# Patient Record
Sex: Female | Born: 1950 | Race: White | Hispanic: No | Smoking: Never smoker
Health system: Southern US, Community
[De-identification: ages and names within clinical notes are randomized; demographics above are authoritative.]

## PROBLEM LIST (undated history)

## (undated) DIAGNOSIS — T753XXA Motion sickness, initial encounter: Secondary | ICD-10-CM

## (undated) DIAGNOSIS — E785 Hyperlipidemia, unspecified: Secondary | ICD-10-CM

## (undated) DIAGNOSIS — I10 Essential (primary) hypertension: Secondary | ICD-10-CM

## (undated) DIAGNOSIS — K219 Gastro-esophageal reflux disease without esophagitis: Secondary | ICD-10-CM

## (undated) DIAGNOSIS — C801 Malignant (primary) neoplasm, unspecified: Secondary | ICD-10-CM

## (undated) HISTORY — DX: Gastro-esophageal reflux disease without esophagitis: K21.9

## (undated) HISTORY — DX: Malignant (primary) neoplasm, unspecified: C80.1

## (undated) HISTORY — DX: Essential (primary) hypertension: I10

---

## 2005-12-12 ENCOUNTER — Inpatient Hospital Stay: Payer: Self-pay

## 2005-12-12 ENCOUNTER — Other Ambulatory Visit: Payer: Self-pay

## 2006-04-06 ENCOUNTER — Ambulatory Visit: Payer: Self-pay | Admitting: Urology

## 2006-09-12 ENCOUNTER — Ambulatory Visit: Payer: Self-pay | Admitting: Urology

## 2007-06-17 ENCOUNTER — Ambulatory Visit: Payer: Self-pay | Admitting: Urology

## 2007-09-19 ENCOUNTER — Ambulatory Visit: Payer: Self-pay | Admitting: General Surgery

## 2008-06-17 ENCOUNTER — Ambulatory Visit: Payer: Self-pay | Admitting: Urology

## 2009-06-21 ENCOUNTER — Ambulatory Visit: Payer: Self-pay | Admitting: Urology

## 2010-07-06 ENCOUNTER — Ambulatory Visit: Payer: Self-pay | Admitting: Urology

## 2010-12-26 IMAGING — US US RENAL KIDNEY
1 series · 17 of 25 positions shown · non-contrast
Comparison: none

REASON FOR EXAM: nephrolithasis  renal neoplasm uncertainity
COMMENTS:

[Series 1: us renal kidney · 17 of 39 slices shown]
[im 1/39]
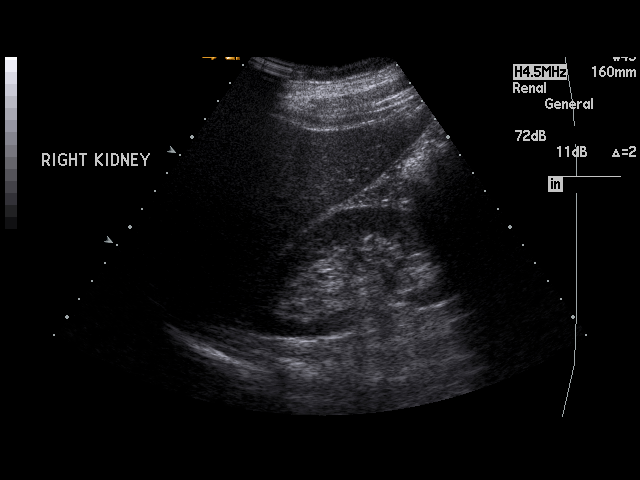
[im 4/39]
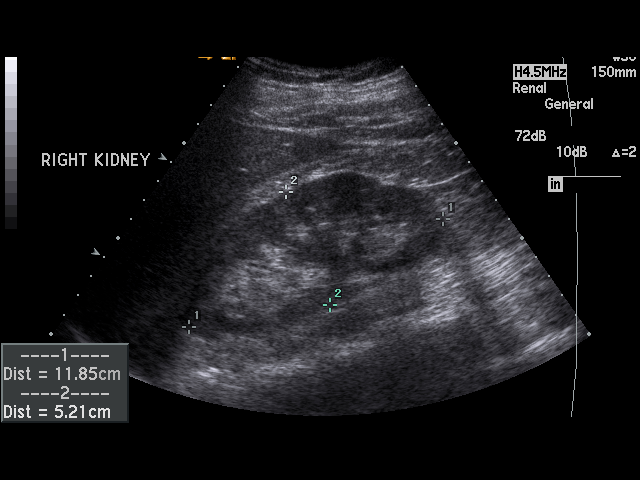
[im 5/39]
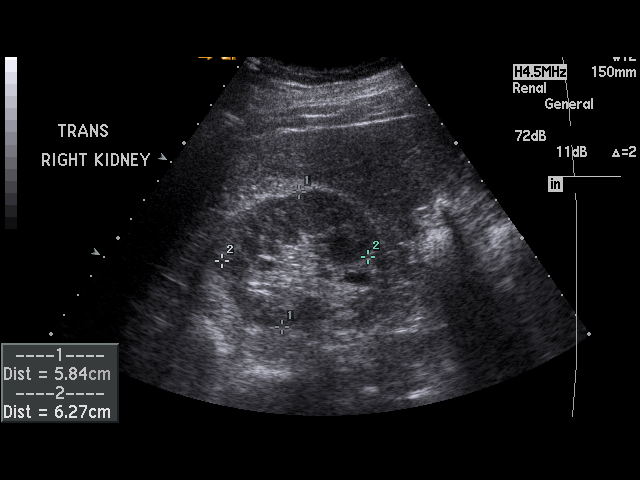
[im 8/39]
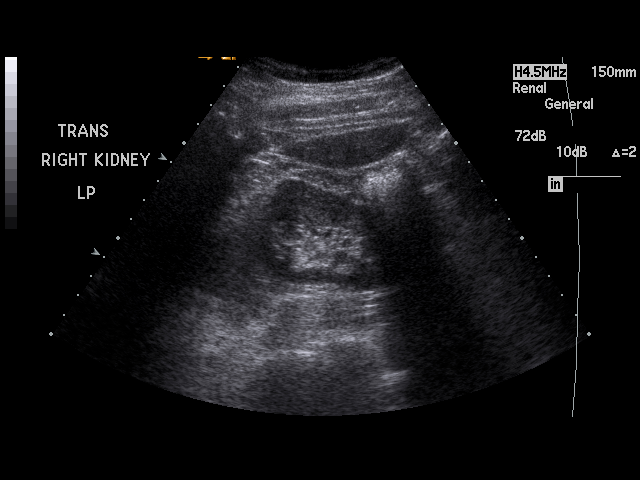
[im 10/39]
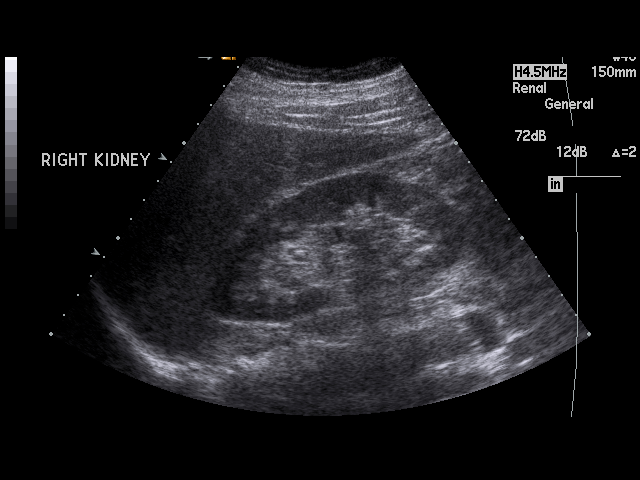
[im 13/39]
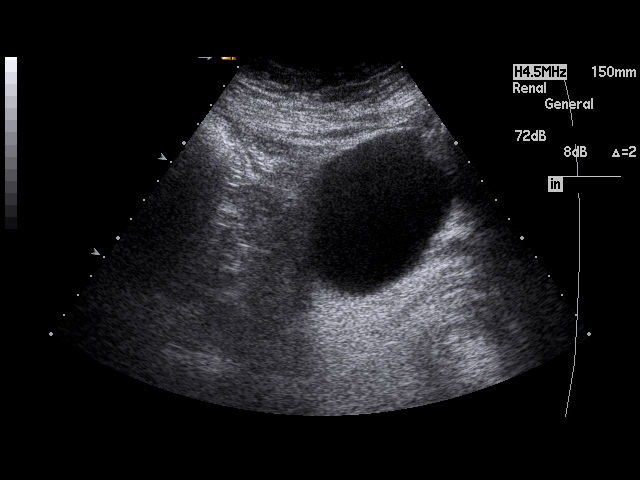
[im 15/39]
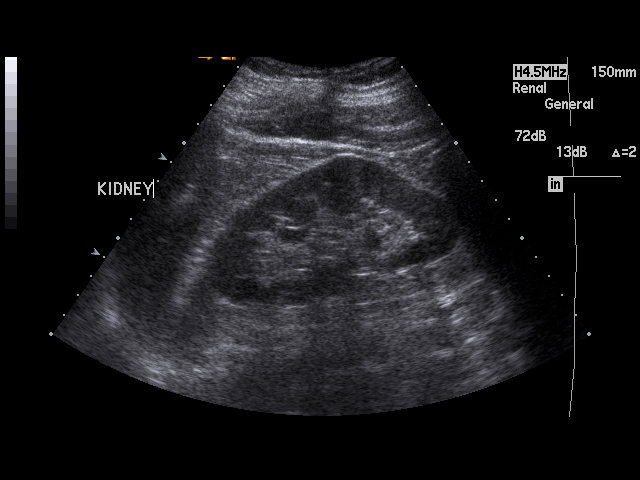
[im 18/39]
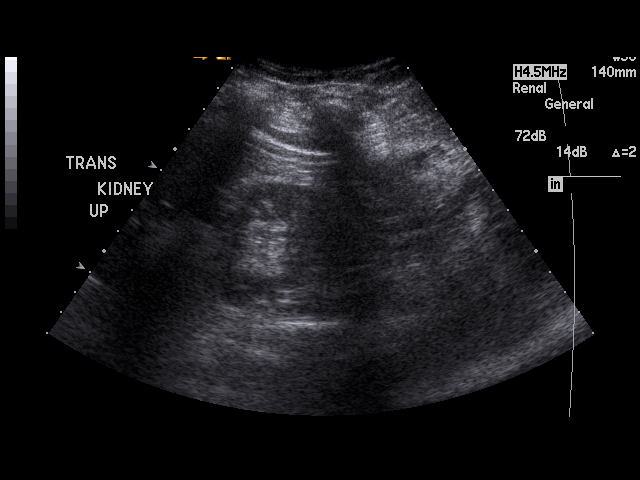
[im 20/39]
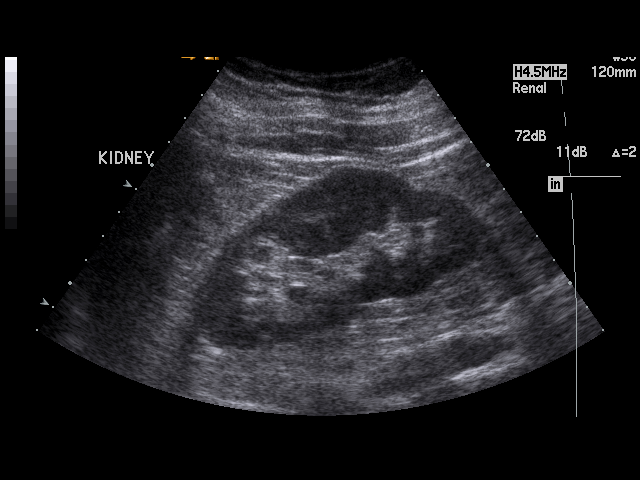
[im 21/39]
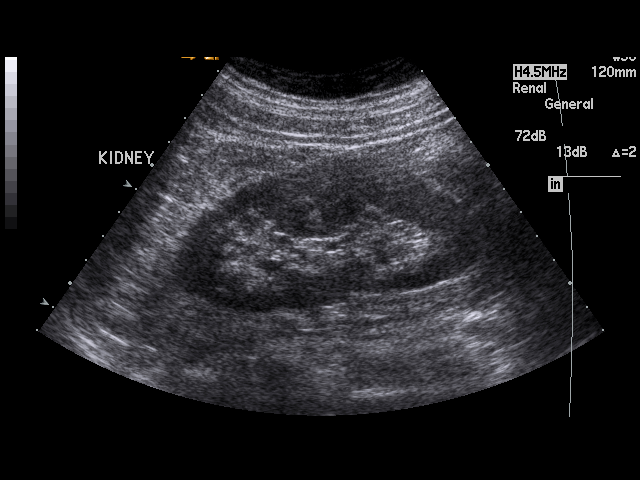
[im 24/39]
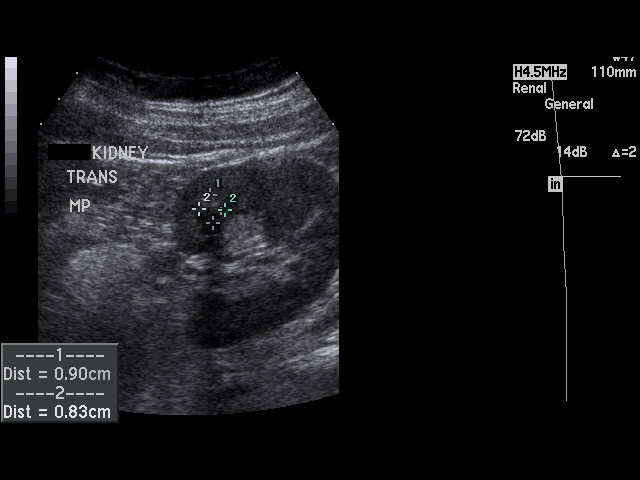
[im 26/39]
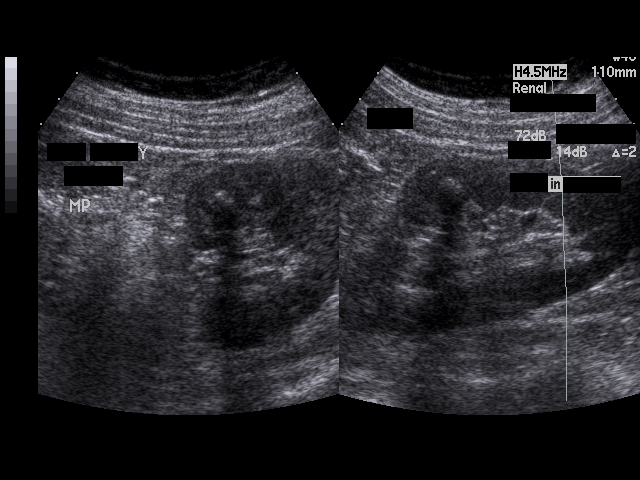
[im 29/39]
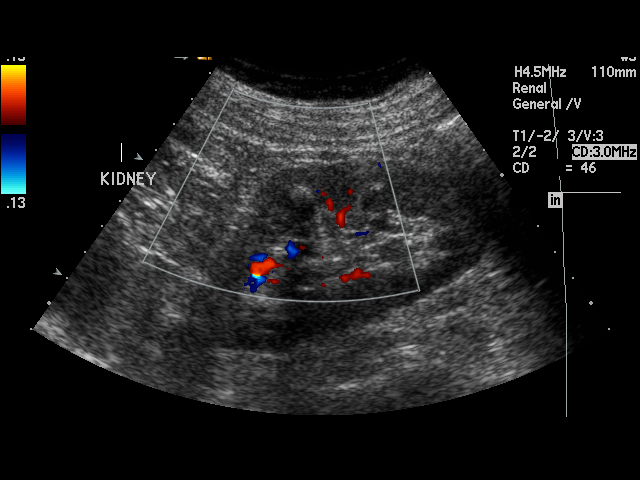
[im 31/39]
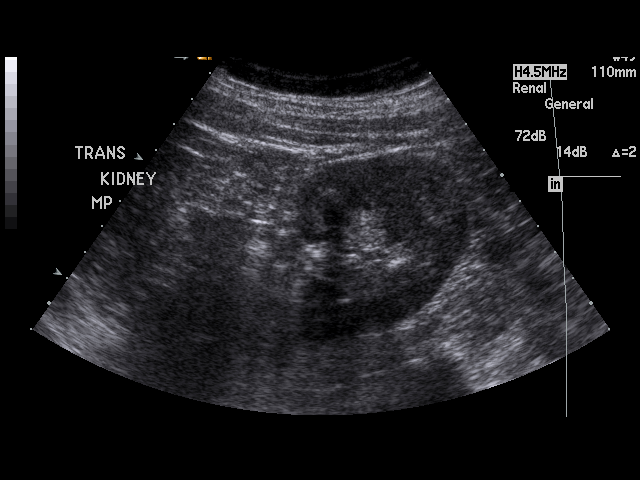
[im 34/39]
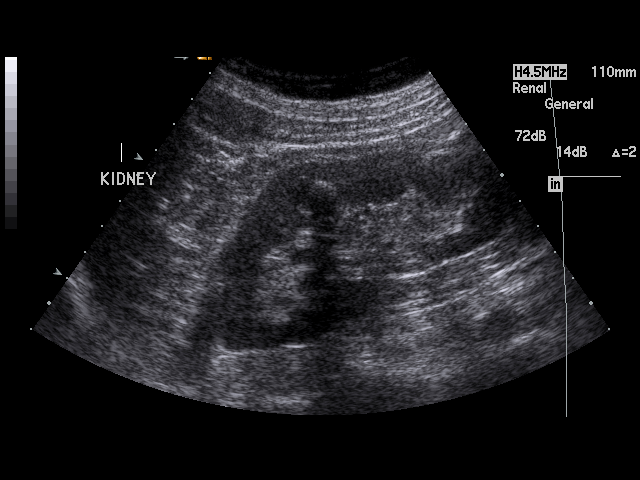
[im 35/39]
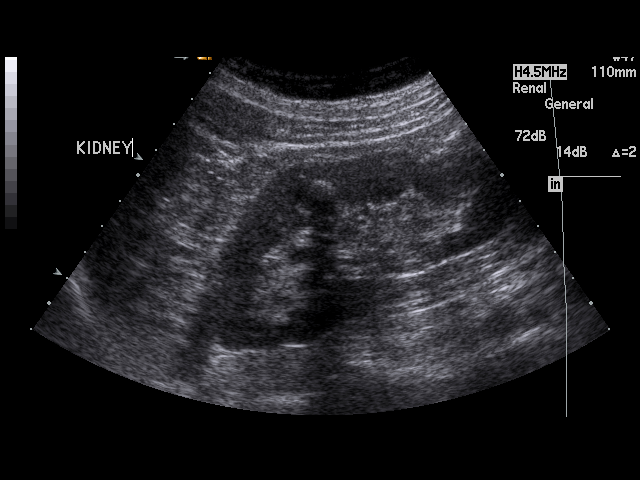
[im 39/39]
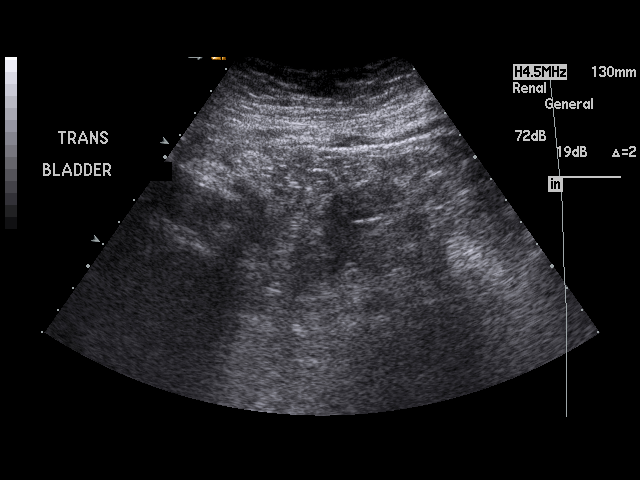

[17 of 25 positions shown; findings below may reference images not displayed]

PROCEDURE:     US  - US KIDNEY  - July 06, 2010  [DATE]

RESULT:     The right kidney measures 11.85 cm x 5.84 cm x 6.27 cm and the
left kidney measure 11.26 cm x 5.61 cm x 6.16 cm. There is again noted a
hyperechoic focus in the left kidney near the junction of the upper pole and
mid pole. There is associated attenuation of the echo beam compatible with
the focus containing calcification. Maximum diameter is 9.6 mm and is
essentially unchanged as compared to the prior ultrasound exam of June 21, 2009. No new renal masses or new renal calcifications are identified. There
is no hydronephrosis. The visualized portion of the urinary bladder is
normal in appearance.
IMPRESSION: 1. The previously noted hyperechoic focus in the left kidney is again seen
and is basically unchanged in appearance as compared to the prior exam of
[DATE] [DATE], [DATE].
[DATE]. No hydronephrosis is seen on either side.
3. The visualized portion of the urinary bladder is normal in appearance.

## 2011-07-12 ENCOUNTER — Ambulatory Visit: Payer: Self-pay | Admitting: Urology

## 2012-01-01 IMAGING — US US RENAL KIDNEY
1 series · 17 of 25 positions shown · non-contrast
Comparison: none

REASON FOR EXAM: left renal mass, nephrolithasis 1 yr FU
COMMENTS:

PROCEDURE:     US  - US KIDNEY  - July 12, 2011  [DATE]
RESULT:     Comparison: Renal ultrasound 07/06/2010
TECHNIQUE: Multiple grayscale and color Doppler images obtained of the
kidneys.

[Series 1: us renal kidney · 17 of 34 slices shown]
[im 1/34]
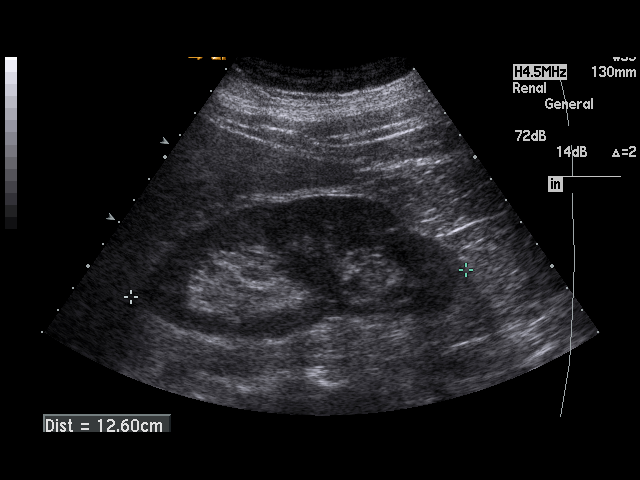
[im 3/34]
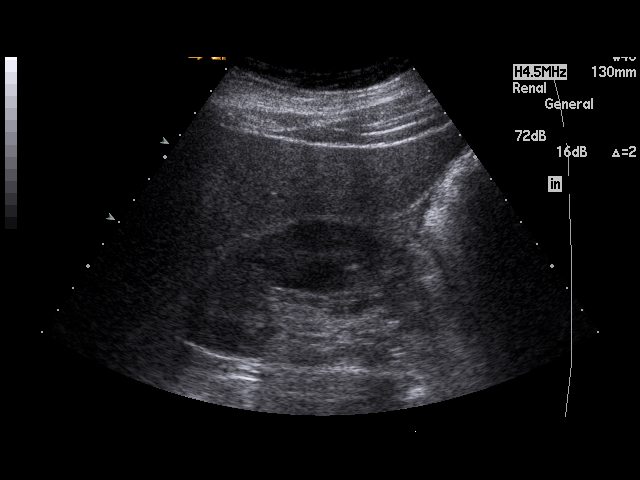
[im 5/34]
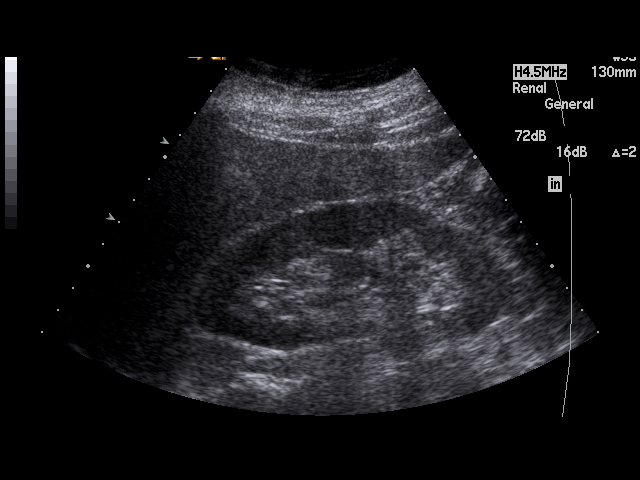
[im 7/34]
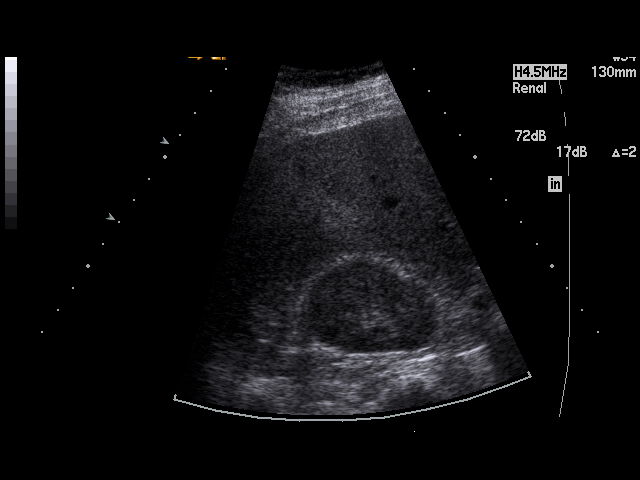
[im 9/34]
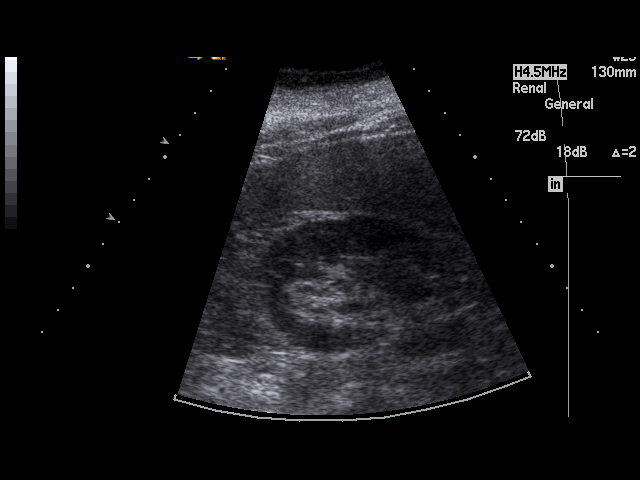
[im 12/34]
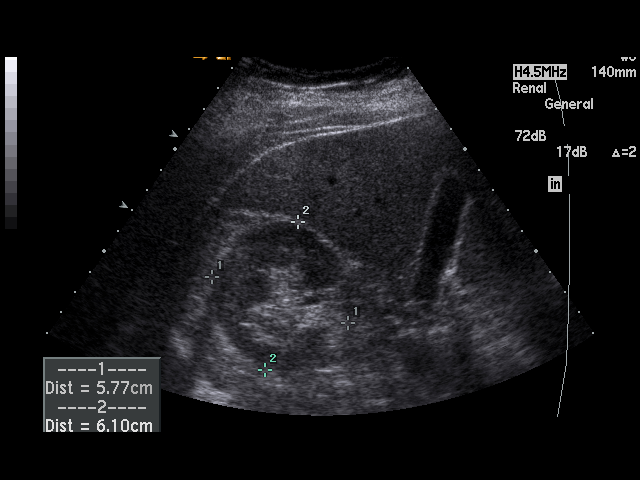
[im 13/34]
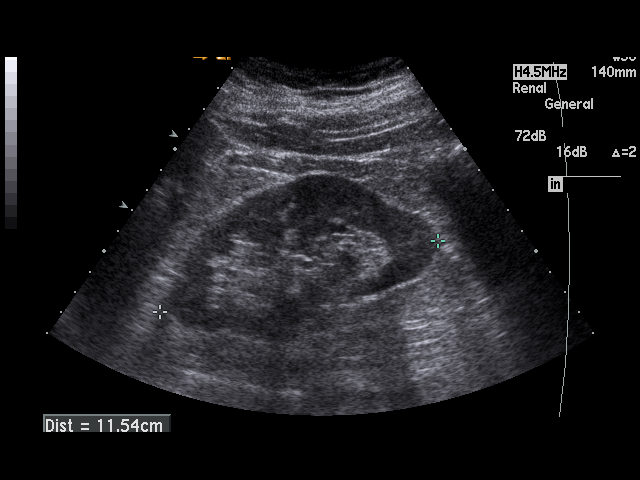
[im 16/34]
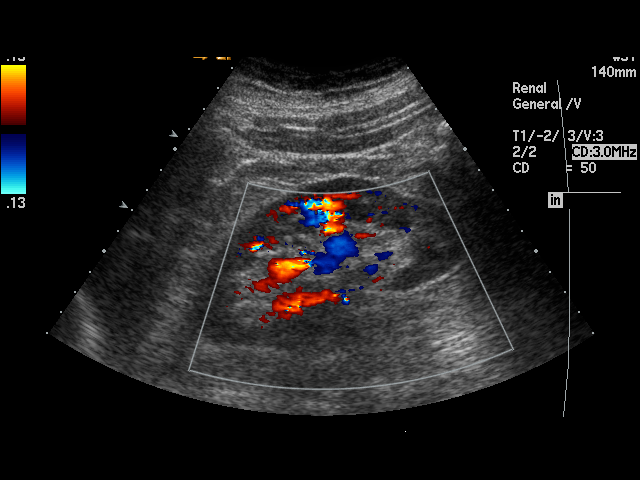
[im 17/34]
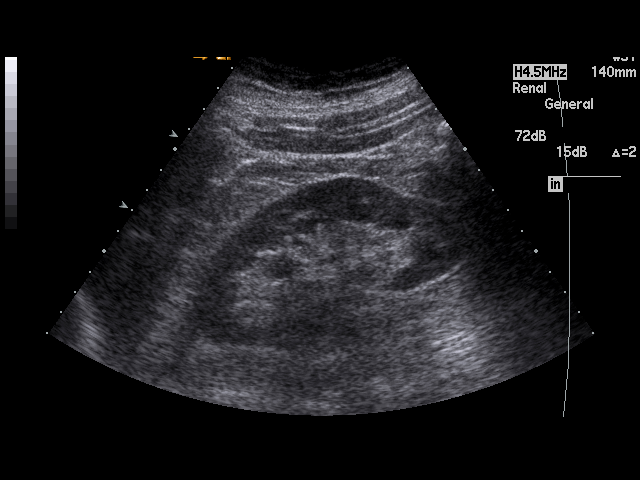
[im 18/34]
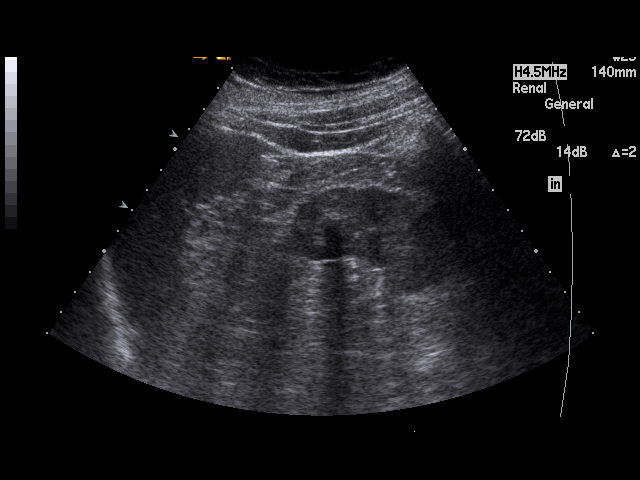
[im 21/34]
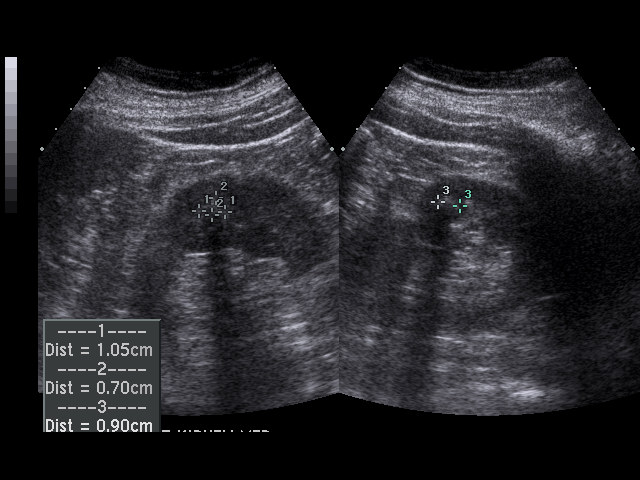
[im 23/34]
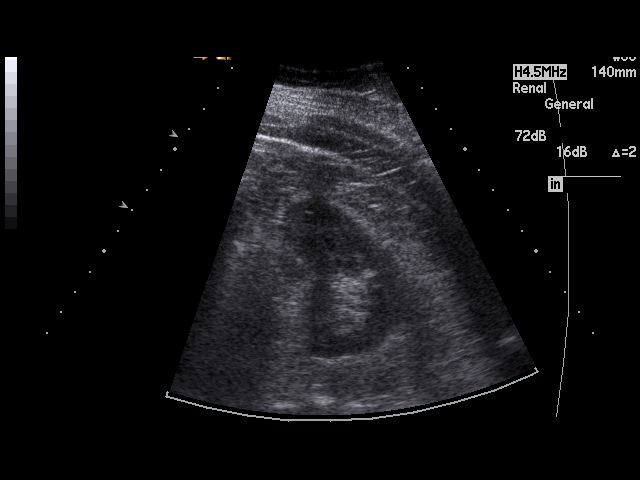
[im 25/34]
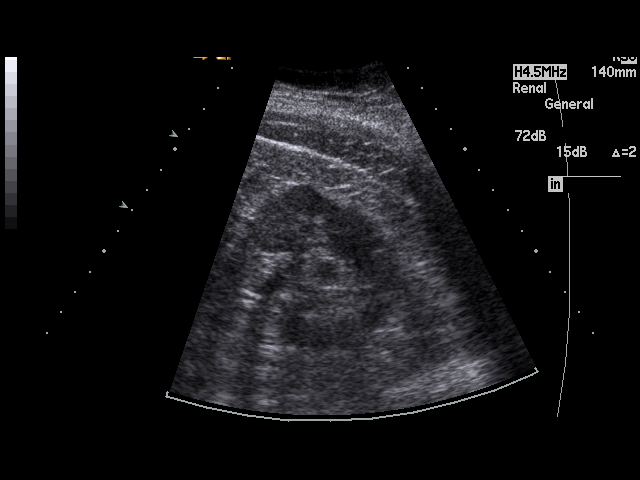
[im 27/34]
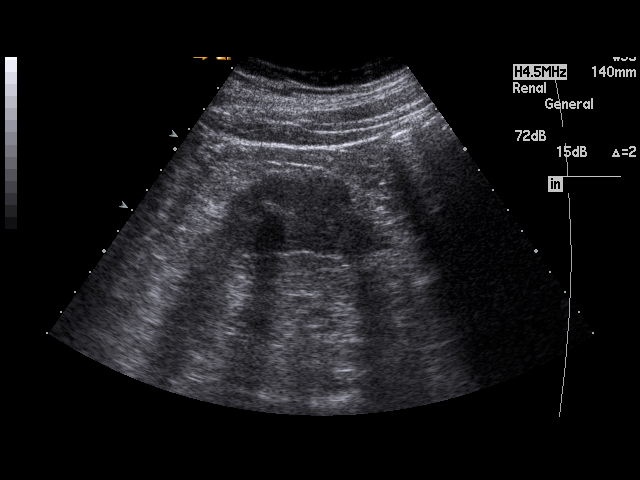
[im 29/34]
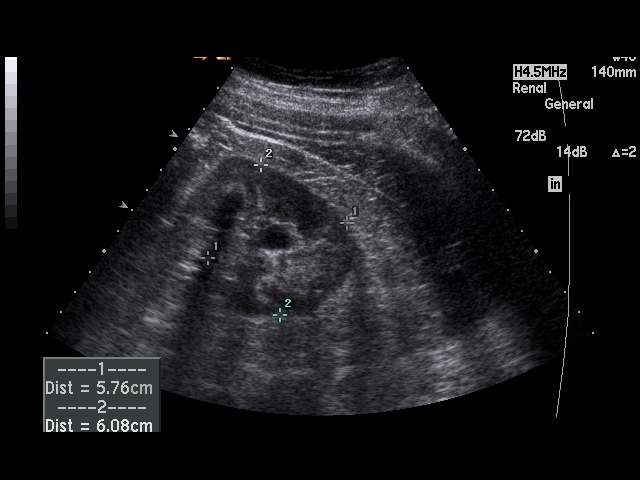
[im 31/34]
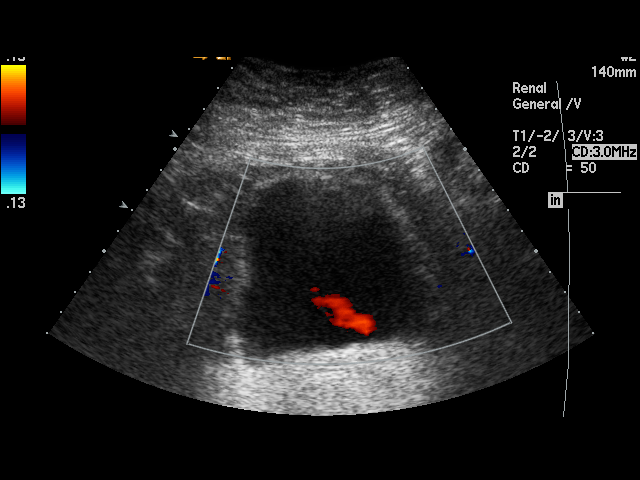
[im 34/34]
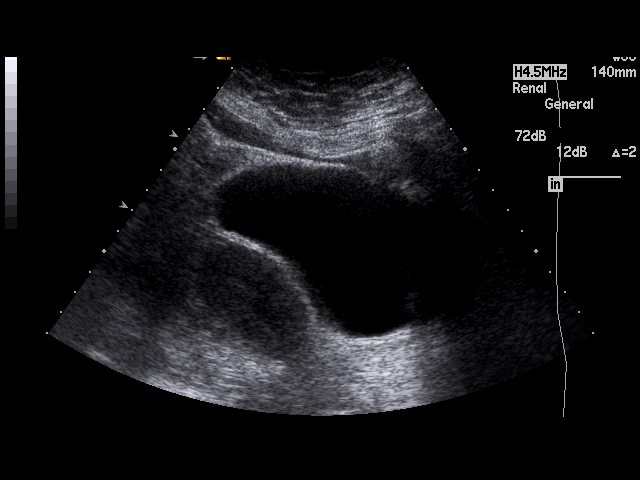

[17 of 25 positions shown; findings below may reference images not displayed]

FINDINGS: The right kidney measures 12.6 x 5.8 x 5.1 cm. The left kidney measures
x 5.5 x 6.7 cm. There is no hydronephrosis. Again demonstrated is a small
round hyperechoic mass in the junction of the interpolar region and superior
pole of the left kidney. It measures 1.1 x 0.9 x 0.7 cm. There is some
acoustic shadowing posterior to the mass. Previously the mass measured 1.0 x
0.9 x 0.8 cm.

Bilateral ureteral jets are seen in the latter.
IMPRESSION: Small hyperechoic mass in the left kidney is essentially unchanged from
prior given differences in scan plane.

## 2012-10-23 ENCOUNTER — Ambulatory Visit: Payer: Self-pay | Admitting: General Surgery

## 2012-10-24 LAB — PATHOLOGY REPORT

## 2012-10-25 ENCOUNTER — Other Ambulatory Visit: Payer: Self-pay | Admitting: General Surgery

## 2013-02-19 ENCOUNTER — Emergency Department: Payer: Self-pay | Admitting: Emergency Medicine

## 2016-12-17 ENCOUNTER — Emergency Department: Payer: BC Managed Care – PPO

## 2016-12-17 ENCOUNTER — Emergency Department
Admission: EM | Admit: 2016-12-17 | Discharge: 2016-12-18 | Disposition: A | Discharge status: Home / Self Care | Payer: BC Managed Care – PPO | Attending: Emergency Medicine | Admitting: Emergency Medicine

## 2016-12-17 ENCOUNTER — Encounter: Payer: Self-pay | Admitting: Emergency Medicine

## 2016-12-17 DIAGNOSIS — R109 Unspecified abdominal pain: Secondary | ICD-10-CM | POA: Diagnosis present

## 2016-12-17 DIAGNOSIS — N200 Calculus of kidney: Secondary | ICD-10-CM | POA: Insufficient documentation

## 2016-12-17 DIAGNOSIS — N390 Urinary tract infection, site not specified: Secondary | ICD-10-CM | POA: Diagnosis not present

## 2016-12-17 HISTORY — DX: Hyperlipidemia, unspecified: E78.5

## 2016-12-17 LAB — URINALYSIS, COMPLETE (UACMP) WITH MICROSCOPIC
BACTERIA UA: NONE SEEN
Bilirubin Urine: NEGATIVE
Glucose, UA: 50 mg/dL — AB
Ketones, ur: 20 mg/dL — AB
Nitrite: NEGATIVE
PROTEIN: NEGATIVE mg/dL
Specific Gravity, Urine: 1.015 (ref 1.005–1.030)
pH: 5 (ref 5.0–8.0)

## 2016-12-17 LAB — CBC
HEMATOCRIT: 41.8 % (ref 35.0–47.0)
HEMOGLOBIN: 14.2 g/dL (ref 12.0–16.0)
MCH: 28.4 pg (ref 26.0–34.0)
MCHC: 34 g/dL (ref 32.0–36.0)
MCV: 83.6 fL (ref 80.0–100.0)
Platelets: 380 10*3/uL (ref 150–440)
RBC: 5 MIL/uL (ref 3.80–5.20)
RDW: 14.3 % (ref 11.5–14.5)
WBC: 12.3 10*3/uL — ABNORMAL HIGH (ref 3.6–11.0)

## 2016-12-17 LAB — COMPREHENSIVE METABOLIC PANEL
ALT: 20 U/L (ref 14–54)
ANION GAP: 11 (ref 5–15)
AST: 21 U/L (ref 15–41)
Albumin: 4 g/dL (ref 3.5–5.0)
Alkaline Phosphatase: 135 U/L — ABNORMAL HIGH (ref 38–126)
BUN: 20 mg/dL (ref 6–20)
CO2: 23 mmol/L (ref 22–32)
Calcium: 9.2 mg/dL (ref 8.9–10.3)
Chloride: 107 mmol/L (ref 101–111)
Creatinine, Ser: 0.94 mg/dL (ref 0.44–1.00)
GFR calc Af Amer: >60 mL/min (ref >60)
GFR calc non Af Amer: >60 mL/min (ref >60)
GLUCOSE: 153 mg/dL — AB (ref 65–99)
POTASSIUM: 3.4 mmol/L — AB (ref 3.5–5.1)
SODIUM: 141 mmol/L (ref 135–145)
Total Bilirubin: 0.8 mg/dL (ref 0.3–1.2)
Total Protein: 7.3 g/dL (ref 6.5–8.1)

## 2016-12-17 LAB — LIPASE, BLOOD: Lipase: 17 U/L (ref 11–51)

## 2016-12-17 MED ORDER — ONDANSETRON HCL 4 MG/2ML IJ SOLN
INTRAMUSCULAR | Status: AC
Start: 2016-12-17 — End: 2016-12-17
  Administered 2016-12-17: 4 mg via INTRAVENOUS
  Filled 2016-12-17: qty 2

## 2016-12-17 MED ORDER — MORPHINE SULFATE (PF) 4 MG/ML IV SOLN
4.0000 mg | Freq: Once | INTRAVENOUS | Status: AC
  Administered 2016-12-17: 4 mg via INTRAVENOUS

## 2016-12-17 MED ORDER — OXYCODONE-ACETAMINOPHEN 5-325 MG PO TABS
1.0000 | ORAL_TABLET | ORAL | 0 refills | Status: DC | PRN
Start: 2016-12-17 — End: 2017-01-15

## 2016-12-17 MED ORDER — CEPHALEXIN 500 MG PO CAPS: 500.0000 mg | ORAL_CAPSULE | Freq: Three times a day (TID) | ORAL | 0 refills | Status: DC

## 2016-12-17 MED ORDER — CEFTRIAXONE SODIUM-DEXTROSE 1-3.74 GM-% IV SOLR
INTRAVENOUS | Status: AC
  Administered 2016-12-17: 1 g via INTRAVENOUS
  Filled 2016-12-17: qty 50

## 2016-12-17 MED ORDER — MORPHINE SULFATE (PF) 4 MG/ML IV SOLN
INTRAVENOUS | Status: AC
  Administered 2016-12-17: 4 mg via INTRAVENOUS
  Filled 2016-12-17: qty 1

## 2016-12-17 MED ORDER — TAMSULOSIN HCL 0.4 MG PO CAPS: 0.4000 mg | ORAL_CAPSULE | Freq: Every day | ORAL | 0 refills | Status: DC

## 2016-12-17 MED ORDER — ONDANSETRON HCL 4 MG/2ML IJ SOLN
4.0000 mg | Freq: Once | INTRAMUSCULAR | Status: AC
  Administered 2016-12-17: 4 mg via INTRAVENOUS

## 2016-12-17 MED ORDER — CEFTRIAXONE SODIUM-DEXTROSE 1-3.74 GM-% IV SOLR
1.0000 g | Freq: Once | INTRAVENOUS | Status: AC
  Administered 2016-12-17: 1 g via INTRAVENOUS

## 2016-12-17 MED ORDER — DEXTROSE 5 % IV SOLN: 1.0000 g | Freq: Once | INTRAVENOUS | Status: DC

## 2016-12-17 MED ORDER — ONDANSETRON HCL 4 MG PO TABS: 4.0000 mg | ORAL_TABLET | Freq: Three times a day (TID) | ORAL | 0 refills | Status: DC | PRN

## 2016-12-17 MED ORDER — ONDANSETRON HCL 4 MG/2ML IJ SOLN
INTRAMUSCULAR | Status: AC
  Administered 2016-12-17: 4 mg via INTRAVENOUS
  Filled 2016-12-17: qty 2

## 2016-12-17 MED ORDER — SODIUM CHLORIDE 0.9 % IV BOLUS (SEPSIS)
1000.0000 mL | Freq: Once | INTRAVENOUS | Status: AC
  Administered 2016-12-17: 1000 mL via INTRAVENOUS

## 2016-12-17 MED ORDER — ONDANSETRON HCL 4 MG/2ML IJ SOLN
4.0000 mg | Freq: Once | INTRAMUSCULAR | Status: AC | PRN
  Administered 2016-12-17: 4 mg via INTRAVENOUS
  Filled 2016-12-17: qty 2

## 2016-12-17 MED ORDER — MORPHINE SULFATE (PF) 4 MG/ML IV SOLN
4.0000 mg | Freq: Once | INTRAVENOUS | Status: AC
Start: 2016-12-17 — End: 2016-12-17
  Administered 2016-12-17: 4 mg via INTRAVENOUS

## 2016-12-17 NOTE — ED Notes (Signed)
Pt. Reports "feeling a little better"

## 2016-12-17 NOTE — ED Notes (Addendum)
PT. States constant RLQ abdominal pain associated with nausea and vomiting since 1600 today. Pt. States pepto bismol at home with no resolve. Pt. Denies other sx infection, reports sudden onset sx.

## 2016-12-17 NOTE — ED Provider Notes (Signed)
[facility] Emergency Department Provider Note   ____________________________________________   I have reviewed the triage vital signs and the nursing notes.   HISTORY  Chief Complaint Flank Pain and Emesis   History limited by: Not Limited   HPI Carmen Dalton is a 65 y.o. female who presents to the emergency department today because of concerns for right flank pain. The pain started roughly 4 hours prior to my presentation. It started suddenly. It is sharp. It has been constant. It is severe. It has been associated with nausea and multiple episodes of vomiting. The patient has not noticed any recent change in defecation or urination. She states she does have a history of kidney stones however is never been this bad. She also has a history of shingles which the somewhat reminds her of.   Past Medical History:  Diagnosis Date  . Hyperlipidemia     There are no active problems to display for this patient.   History reviewed. No pertinent surgical history.  Prior to Admission medications   Not on File    Allergies Penicillins  No family history on file.  Social History Social History  Substance Use Topics  . Smoking status: Never Smoker  . Smokeless tobacco: Never Used  . Alcohol use No    Review of Systems  Constitutional: Negative for fever. Cardiovascular: Negative for chest pain. Respiratory: Negative for shortness of breath. Gastrointestinal: Positive for right flank pain, nausea and vomiting Genitourinary: Negative for dysuria. Musculoskeletal: Negative for back pain. Skin: Negative for rash. Neurological: Negative for headaches, focal weakness or numbness.  10-point ROS otherwise negative.  ____________________________________________   PHYSICAL EXAM:  VITAL SIGNS: ED Triage Vitals  Enc Vitals Group     BP 12/17/16 1859 (!) 163/63     Pulse Rate 12/17/16 1859 95     Resp 12/17/16 1859 18     Temp 12/17/16 1859 98.6 F  (37 C)     Temp Source 12/17/16 1859 Oral     SpO2 12/17/16 1859 98 %     Weight 12/17/16 1855 185 lb (83.9 kg)     Height 12/17/16 1855 5\' 4"  (1.626 m)     Head Circumference --      Peak Flow --      Pain Score 12/17/16 1855 10     Pain Loc --      Pain Edu? --      Excl. in [place]? --      Constitutional: Alert and oriented. Appears uncomfortable. Eyes: Conjunctivae are normal. Normal extraocular movements. ENT   Head: Normocephalic and atraumatic.   Nose: No congestion/rhinnorhea.   Mouth/Throat: Mucous membranes are moist.   Neck: No stridor. Hematological/Lymphatic/Immunilogical: No cervical lymphadenopathy. Cardiovascular: Normal rate, regular rhythm.  No murmurs, rubs, or gallops.  Respiratory: Normal respiratory effort without tachypnea nor retractions. Breath sounds are clear and equal bilaterally. No wheezes/rales/rhonchi. Gastrointestinal: Soft and non tender. No rebound. No guarding.  Genitourinary: Deferred Musculoskeletal: Normal range of motion in all extremities. No lower extremity edema. Neurologic:  Normal speech and language. No gross focal neurologic deficits are appreciated.  Skin:  Skin is warm, dry and intact. No rash noted. Psychiatric: Mood and affect are normal. Speech and behavior are normal. Patient exhibits appropriate insight and judgment.  ____________________________________________    LABS (pertinent positives/negatives)  Labs Reviewed  COMPREHENSIVE METABOLIC PANEL - Abnormal; Notable for the following:       Result Value   Potassium 3.4 (*)    Glucose,  Bld 153 (*)    Alkaline Phosphatase 135 (*)    All other components within normal limits  CBC - Abnormal; Notable for the following:    WBC 12.3 (*)    All other components within normal limits  URINALYSIS, COMPLETE (UACMP) WITH MICROSCOPIC - Abnormal; Notable for the following:    Color, Urine YELLOW (*)    APPearance CLEAR (*)    Glucose, UA 50 (*)    Hgb urine dipstick  SMALL (*)    Ketones, ur 20 (*)    Leukocytes, UA LARGE (*)    Squamous Epithelial / LPF 0-5 (*)    All other components within normal limits  LIPASE, BLOOD     ____________________________________________   EKG  None  ____________________________________________    RADIOLOGY  CT renal stone IMPRESSION:  Moderate right hydroureteronephrosis secondary to 4.4 mm obstructing  stone right ureterovesical junction.    Left-sided parapelvic cysts. 1 cm left renal cyst and 9 mm  calcification.    Scattered colonic diverticula. There is minimal haziness adjacent to  descending colon diverticula but appears long-standing rather than  representing acute diverticulitis.    Small hiatal hernia.    Aortic atherosclerosis.    Fat and vessel containing right periumbilical 2.5 cm hernia.    Degenerative changes lumbar spine most notable L3-4 through L5-S1.      ____________________________________________   PROCEDURES  Procedures  ____________________________________________   INITIAL IMPRESSION / ASSESSMENT AND PLAN / ED COURSE  Pertinent labs & imaging results that were available during my care of the patient were reviewed by me and considered in my medical decision making (see chart for details).  Patient presented to the emergency department today because of concerns for right flank pain. The patient had a CT scan which was consistent with a stone at the UVJ. Urine did show some signs of infection. Discussed with Dr. Tresa Moore with urology. Thought she was appropriate for trial of outpatient management. Will plan on giving patient dose of antibiotics in the emergency department. Will discharge with abx, pain medication, nausea medication and flomax.  ____________________________________________   FINAL CLINICAL IMPRESSION(S) / ED DIAGNOSES  Final diagnoses:  Right flank pain  Kidney stone  Lower urinary tract infectious disease     Note: This dictation  was prepared with Dragon dictation. Any transcriptional errors that result from this process are unintentional     Nance Pear, MD 12/17/16 2253

## 2016-12-17 NOTE — ED Notes (Signed)
Pt is in good condition; discharge instructions reviewed, follow up care and home care reviewed; prescription medication reviewed; pt verbalized understanding; pt is ambulatory but requests a wheelchair to leave ED with adult children

## 2016-12-17 NOTE — Discharge Instructions (Signed)
Please seek medical attention for any high fevers, chest pain, shortness of breath, change in behavior, persistent vomiting, bloody stool or any other new or concerning symptoms.  

## 2016-12-17 NOTE — ED Triage Notes (Signed)
Pt reports that at 4pm she started having severe right side pain and vomiting

## 2016-12-21 ENCOUNTER — Ambulatory Visit: Payer: BC Managed Care – PPO | Admitting: Urology

## 2016-12-21 ENCOUNTER — Encounter: Payer: Self-pay | Admitting: Urology

## 2016-12-21 VITALS — BP 168/77 | HR 106 | Ht 64.0 in | Wt 183.0 lb

## 2016-12-21 DIAGNOSIS — N200 Calculus of kidney: Secondary | ICD-10-CM | POA: Diagnosis not present

## 2016-12-21 MED ORDER — TAMSULOSIN HCL 0.4 MG PO CAPS: 0.4000 mg | ORAL_CAPSULE | Freq: Every day | ORAL | 0 refills | Status: DC

## 2016-12-21 MED ORDER — OXYCODONE-ACETAMINOPHEN 5-325 MG PO TABS: 1.0000 | ORAL_TABLET | ORAL | 0 refills | Status: DC | PRN

## 2016-12-21 NOTE — Progress Notes (Signed)
12/21/2016 11:13 AM   Carmen Dalton 27-Oct-1951 [id number]  Referring provider: Albina Billet, MD [address]   [place], [place] [address]  Chief Complaint  Patient presents with  . Nephrolithiasis    New Patient    HPI: The patient is a 65 year old female presents for follow-up after being diagnosed the 4 mm right UVJ stone.  Her presenting symptom to the emergency room was right flank pain. This has since become more controlled though she does need Percocet occasionally. She has had nausea and vomiting that has improved. She denies fevers or chills. She was told the past that she had kidney stones though she is not sure if she has ever passed one before. She has never felt pain like this before. CT scan shows no other stone burden onset of her ureteral stone. She has no dysuria or other urinary complaints at this time.   PMH: Past Medical History:  Diagnosis Date  . GERD (gastroesophageal reflux disease)   . Hyperlipidemia     Surgical History: Past Surgical History:  Procedure Laterality Date  . CESAREAN SECTION  1980    Home Medications:  Allergies as of 12/21/2016      Reactions   Penicillins       Medication List       Accurate as of 12/21/16 11:13 AM. Always use your most recent med list.          atorvastatin 80 MG tablet Commonly known as:  LIPITOR Take by mouth.   cephALEXin 500 MG capsule Commonly known as:  KEFLEX Take 1 capsule (500 mg total) by mouth 3 (three) times daily.   ondansetron 4 MG tablet Commonly known as:  ZOFRAN Take 1 tablet (4 mg total) by mouth every 8 (eight) hours as needed for nausea or vomiting.   oxyCODONE-acetaminophen 5-325 MG tablet Commonly known as:  ROXICET Take 1 tablet by mouth every 4 (four) hours as needed for severe pain.   oxyCODONE-acetaminophen 5-325 MG tablet Commonly known as:  ROXICET Take 1-2 tablets by mouth every 4 (four) hours as needed for severe pain.   tamsulosin 0.4 MG Caps  capsule Commonly known as:  FLOMAX Take 1 capsule (0.4 mg total) by mouth daily.   tamsulosin 0.4 MG Caps capsule Commonly known as:  FLOMAX Take 1 capsule (0.4 mg total) by mouth daily.       Allergies:  Allergies  Allergen Reactions  . Penicillins     Family History: Family History  Problem Relation Age of Onset  . Prostate cancer Neg Hx   . Kidney cancer Neg Hx     Social History:  reports that she has never smoked. She has never used smokeless tobacco. She reports that she does not drink alcohol or use drugs.  ROS: UROLOGY Frequent Urination?: No Hard to postpone urination?: No Burning/pain with urination?: No Get up at night to urinate?: Yes Leakage of urine?: No Urine stream starts and stops?: No Trouble starting stream?: No Do you have to strain to urinate?: No Blood in urine?: No Urinary tract infection?: No Sexually transmitted disease?: No Injury to kidneys or bladder?: No Painful intercourse?: No Weak stream?: No Currently pregnant?: No Vaginal bleeding?: No Last menstrual period?: 2009  Gastrointestinal Nausea?: Yes Vomiting?: Yes Indigestion/heartburn?: No Diarrhea?: No Constipation?: No  Constitutional Fever: No Night sweats?: No Weight loss?: No Fatigue?: No  Skin Skin rash/lesions?: No Itching?: No  Eyes Blurred vision?: No Double vision?: No  Ears/Nose/Throat Sore throat?: No  Sinus problems?: No  Hematologic/Lymphatic Swollen glands?: No Easy bruising?: No  Cardiovascular Leg swelling?: No Chest pain?: No  Respiratory Cough?: No Shortness of breath?: No  Endocrine Excessive thirst?: No  Musculoskeletal Back pain?: No Joint pain?: No  Neurological Headaches?: No Dizziness?: No  Psychologic Depression?: No Anxiety?: No  Physical Exam: BP (!) 168/77   Pulse (!) 106   Ht 5\' 4"  (1.626 m)   Wt 183 lb (83 kg)   LMP 12/26/2007 (Approximate)   BMI 31.41 kg/m   Constitutional:  Alert and oriented, No  acute distress. HEENT: [place] AT, moist mucus membranes.  Trachea midline, no masses. Cardiovascular: No clubbing, cyanosis, or edema. Respiratory: Normal respiratory effort, no increased work of breathing. GI: Abdomen is soft, nontender, nondistended, no abdominal masses GU: No CVA tenderness.  Skin: No rashes, bruises or suspicious lesions. Lymph: No cervical or inguinal adenopathy. Neurologic: Grossly intact, no focal deficits, moving all 4 extremities. Psychiatric: Normal mood and affect.  Laboratory Data: Lab Results  Component Value Date   WBC 12.3 (H) 12/17/2016   HGB 14.2 12/17/2016   HCT 41.8 12/17/2016   MCV 83.6 12/17/2016   PLT 380 12/17/2016    Lab Results  Component Value Date   CREATININE 0.94 12/17/2016    No results found for: PSA  No results found for: TESTOSTERONE  No results found for: HGBA1C  Urinalysis    Component Value Date/Time   COLORURINE YELLOW (A) 12/17/2016 2158   APPEARANCEUR CLEAR (A) 12/17/2016 2158   LABSPEC 1.015 12/17/2016 2158   PHURINE 5.0 12/17/2016 2158   GLUCOSEU 50 (A) 12/17/2016 2158   HGBUR SMALL (A) 12/17/2016 2158   BILIRUBINUR NEGATIVE 12/17/2016 2158   KETONESUR 20 (A) 12/17/2016 2158   PROTEINUR NEGATIVE 12/17/2016 2158   NITRITE NEGATIVE 12/17/2016 2158   LEUKOCYTESUR LARGE (A) 12/17/2016 2158    Pertinent Imaging: CT abdomen and pelvis reviewed. 4 mm right UVJ stone with right hydroureteronephrosis nephrosis that is mild.  Assessment & Plan:   1. Right UVJ stone I discussed management options with the patient regarding her distal ureteral stone. We discussed medical expulsive therapy, ureteroscopy, and lithotripsy. She was to continue medical expulsive therapy. We'll have her follow-up in 2 weeks with a KUB prior. She was given a prescription for pain medications and Flomax as well as a urine strainer.   Return in about 2 weeks (around 01/04/2017) for KUB prior.  Nickie Retort, MD  [facility] [address], [place], [place] [address] [phone]

## 2016-12-21 NOTE — Addendum Note (Signed)
Addended by: Tommy Rainwater on: 12/21/2016 11:15 AM   Modules accepted: Orders

## 2017-01-15 ENCOUNTER — Ambulatory Visit: Payer: BC Managed Care – PPO | Admitting: Urology

## 2017-01-15 ENCOUNTER — Ambulatory Visit
Admission: RE | Admit: 2017-01-15 | Discharge: 2017-01-15 | Disposition: A | Discharge status: Home / Self Care | Payer: BC Managed Care – PPO | Source: Ambulatory Visit | Attending: Urology | Admitting: Urology

## 2017-01-15 VITALS — BP 135/81 | HR 79 | Ht 64.0 in | Wt 179.5 lb

## 2017-01-15 DIAGNOSIS — N200 Calculus of kidney: Secondary | ICD-10-CM

## 2017-01-15 NOTE — Progress Notes (Signed)
01/15/2017 11:53 AM   Cephus Slater [birth year] [id number]  Referring provider: Albina Billet, MD [address]   [place], [place] [address]  Chief Complaint  Patient presents with  . Follow-up    Nephrolithiasis     HPI: 66 year old white female who presents today for follow-up of a right distal ureteral stone. She was diagnosed with Dr. Joaquim Lai on 12/17/16. Since she was last seen she is remained asymptomatic. She denies any dysuria, frequency, urgency, or worse hematuria. She denies any fevers or chills. She's not had any flank pain. She is not sure if she passed her stone or not as she is not straining every void. A KUB was performed prior to her appointment today which demonstrated no evidence of the stone.     PMH: Past Medical History:  Diagnosis Date  . GERD (gastroesophageal reflux disease)   . Hyperlipidemia     Surgical History: Past Surgical History:  Procedure Laterality Date  . CESAREAN SECTION  1980    Home Medications:  Allergies as of 01/15/2017      Reactions   Penicillins       Medication List       Accurate as of 01/15/17 11:53 AM. Always use your most recent med list.          atorvastatin 80 MG tablet Commonly known as:  LIPITOR Take by mouth.       Allergies:  Allergies  Allergen Reactions  . Penicillins     Family History: Family History  Problem Relation Age of Onset  . Prostate cancer Neg Hx   . Kidney cancer Neg Hx     Social History:  reports that she has never smoked. She has never used smokeless tobacco. She reports that she does not drink alcohol or use drugs.  ROS: UROLOGY Frequent Urination?: No Hard to postpone urination?: No Burning/pain with urination?: No Get up at night to urinate?: No Leakage of urine?: No Urine stream starts and stops?: No Trouble starting stream?: No Do you have to strain to urinate?: No Blood in urine?: No Urinary tract infection?: No Sexually transmitted disease?: No Injury to  kidneys or bladder?: No Painful intercourse?: No Weak stream?: No Currently pregnant?: No Vaginal bleeding?: No Last menstrual period?: 2009  Gastrointestinal Nausea?: No Vomiting?: No Indigestion/heartburn?: No Diarrhea?: No Constipation?: No  Constitutional Fever: No Night sweats?: No Weight loss?: No Fatigue?: No  Skin Skin rash/lesions?: No Itching?: No  Eyes Blurred vision?: No Double vision?: No  Ears/Nose/Throat Sore throat?: No Sinus problems?: No  Hematologic/Lymphatic Swollen glands?: No Easy bruising?: No  Cardiovascular Leg swelling?: No Chest pain?: No  Respiratory Cough?: No Shortness of breath?: No  Endocrine Excessive thirst?: No  Musculoskeletal Back pain?: No Joint pain?: No  Neurological Headaches?: No Dizziness?: No  Psychologic Depression?: No Anxiety?: No  Physical Exam: BP 135/81   Pulse 79   Ht 5\' 4"  (1.626 m)   Wt 81.4 kg (179 lb 8 oz)   LMP 12/26/2007 (Approximate)   BMI 30.81 kg/m   Constitutional:  Alert and oriented, No acute distress.   Psychiatric: Normal mood and affect.  Laboratory Data: Lab Results  Component Value Date   WBC 12.3 (H) 12/17/2016   HGB 14.2 12/17/2016   HCT 41.8 12/17/2016   MCV 83.6 12/17/2016   PLT 380 12/17/2016    Lab Results  Component Value Date   CREATININE 0.94 12/17/2016    No results found for: PSA  No results found  for: TESTOSTERONE  No results found for: HGBA1C  Urinalysis    Component Value Date/Time   COLORURINE YELLOW (A) 12/17/2016 2158   APPEARANCEUR CLEAR (A) 12/17/2016 2158   LABSPEC 1.015 12/17/2016 2158   PHURINE 5.0 12/17/2016 2158   GLUCOSEU 50 (A) 12/17/2016 2158   HGBUR SMALL (A) 12/17/2016 2158   BILIRUBINUR NEGATIVE 12/17/2016 2158   KETONESUR 20 (A) 12/17/2016 2158   PROTEINUR NEGATIVE 12/17/2016 2158   NITRITE NEGATIVE 12/17/2016 2158   LEUKOCYTESUR LARGE (A) 12/17/2016 2158    Pertinent Imaging: The patient had a KUB prior to her  exam today. I've independently reviewed the film which demonstrates no evidence of calcifications along expected trajectory of the right ureter.  Assessment & Plan:  I suspect the patient has passed her stone. She is asymptomatic and the KUB suggests no evidence of it. She had been straining her urine intermittently, she did not catch her stone.  1. Nephrolithiasis I reassured the patient. She has no additional stones within her urinary tract. She does have a parenchymal calcification in the left kidney which has been there for a long time and followed previously. This is been unchanged over. As the last 10 years. This needs no additional follow-up.  The patient will follow up with [place] on an as-needed basis. We did discuss in general stone prevention strategies.  Cc: Dr. Benita Stabile, M.D. Return if symptoms worsen or fail to improve.  Ardis Hughs, [facility] [address], [place], [place] [address] [phone]

## 2017-07-07 IMAGING — CR DG ABDOMEN 1V
1 series · 2 of 2 positions shown · non-contrast
Comparison: Abdominopelvic CT stone study December 17, 2016

CLINICAL DATA: Follow-up x-ray. Patient diagnosis with a
right-sided kidney stone on December 17, 2016. No current
complaints.

EXAM:
ABDOMEN - 1 VIEW

[Series 1: dg abd 1 view · 0.14mm/px · 2 of 2 slices shown]
[im 1/2]
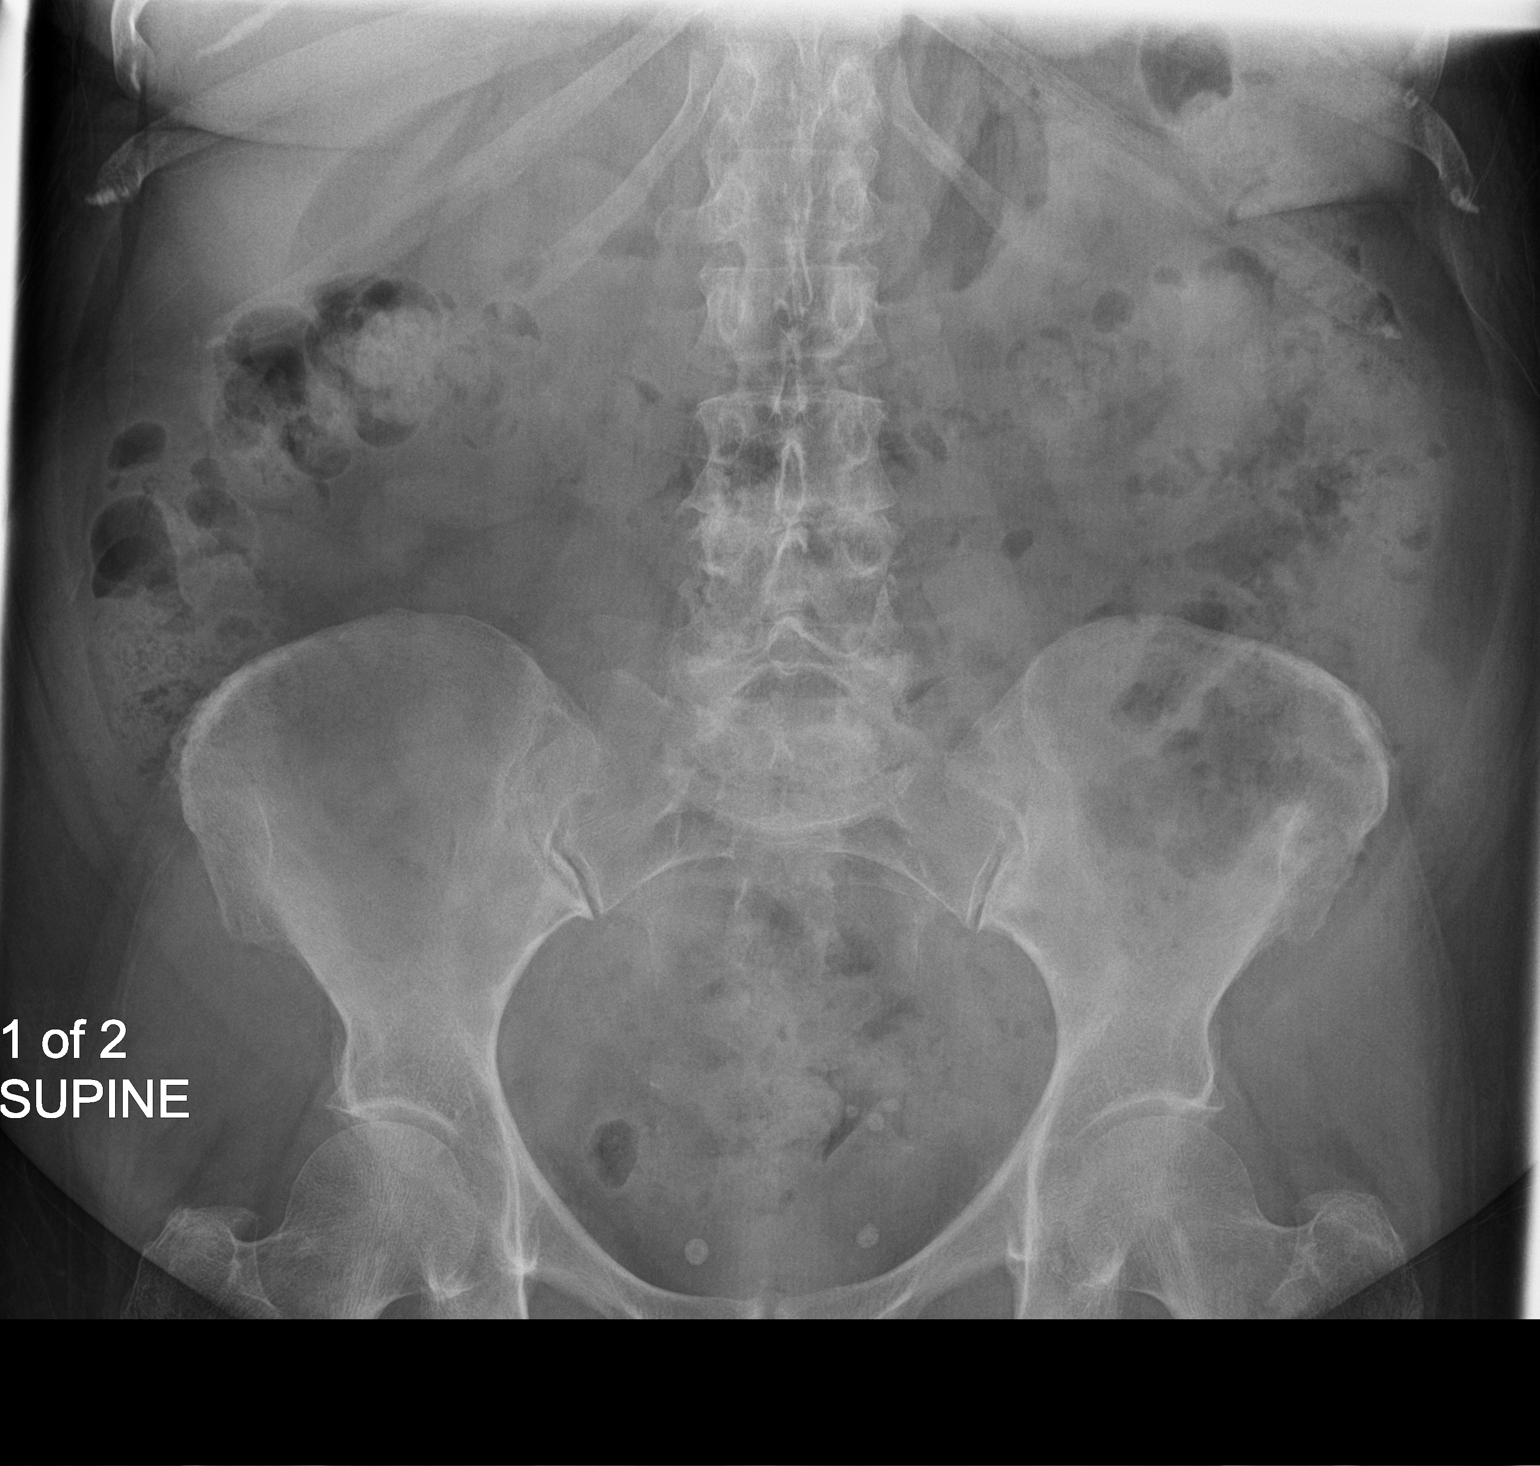
[im 2/2]
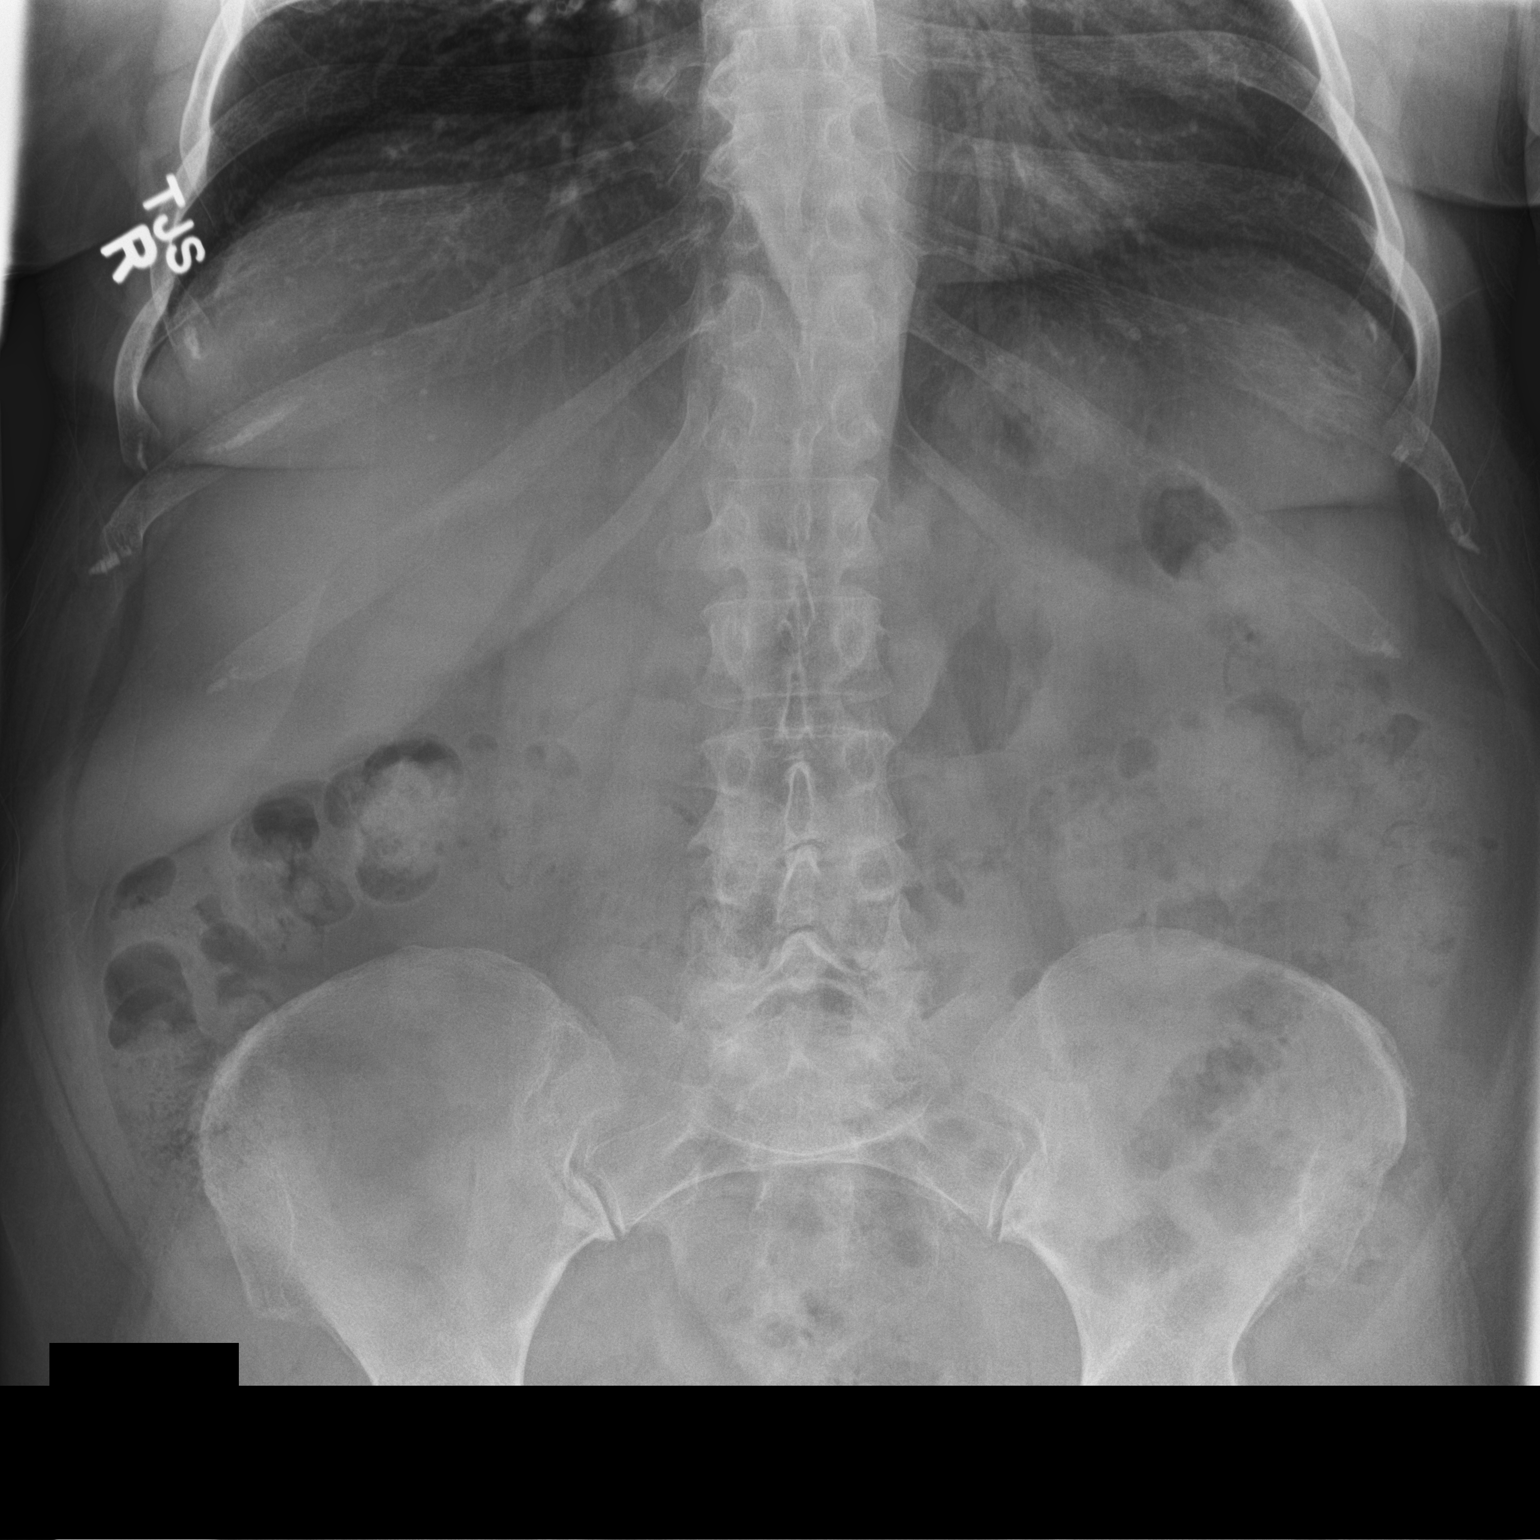

[2 of 2 positions shown; findings below may reference images not displayed]

FINDINGS: No stones are evident overlying either kidney nor along the course
of the ureters. Faint calcification projects over the periphery of
the midpole parenchyma of the left kidney consistent with a known
calcification. There are phleboliths within the pelvis. The 4 mm
diameter UPJ stone on the right is not evident today. The bowel gas
pattern is normal. The bony structures exhibit no acute
abnormalities.
IMPRESSION: No definite residual calcified urinary tract stone. The coarse
midpole renal parenchymal calcification noted on the left is only
faintly visible today.

## 2017-12-25 HISTORY — PX: BREAST LUMPECTOMY: SHX2

## 2018-08-28 DIAGNOSIS — D0511 Intraductal carcinoma in situ of right breast: Secondary | ICD-10-CM | POA: Insufficient documentation

## 2018-10-07 DIAGNOSIS — C50411 Malignant neoplasm of upper-outer quadrant of right female breast: Secondary | ICD-10-CM | POA: Insufficient documentation

## 2021-09-21 ENCOUNTER — Other Ambulatory Visit: Payer: Self-pay

## 2021-09-21 NOTE — Progress Notes (Signed)
CALLED PATIENT NO ANSWER LETTER SENT

## 2021-09-27 ENCOUNTER — Telehealth: Payer: Self-pay

## 2021-09-27 NOTE — Telephone Encounter (Signed)
Pt.calling to schedule colonoscopy

## 2021-09-28 ENCOUNTER — Other Ambulatory Visit: Payer: Self-pay

## 2021-09-28 DIAGNOSIS — Z860100 Personal history of colon polyps, unspecified: Secondary | ICD-10-CM

## 2021-09-28 MED ORDER — PEG 3350-KCL-NA BICARB-NACL 420 G PO SOLR: 4000.0000 mL | Freq: Once | ORAL | 0 refills | Status: AC

## 2021-09-28 NOTE — Progress Notes (Signed)
Gastroenterology Pre-Procedure Review  Request Date: 11/10/2021 Requesting Physician: Dr. Marius Ditch  PATIENT REVIEW QUESTIONS: The patient responded to the following health history questions as indicated:    1. Are you having any GI issues? no 2. Do you have a personal history of Polyps? yes (REMOVED) 3. Do you have a family history of Colon Cancer or Polyps? no 4. Diabetes Mellitus? no 5. Joint replacements in the past 12 months?no 6. Major health problems in the past 3 months?no 7. Any artificial heart valves, MVP, or defibrillator?no    MEDICATIONS & ALLERGIES:    Patient reports the following regarding taking any anticoagulation/antiplatelet therapy:   Plavix, Coumadin, Eliquis, Xarelto, Lovenox, Pradaxa, Brilinta, or Effient? no Aspirin? no  Patient confirms/reports the following medications:  Current Outpatient Medications  Medication Sig Dispense Refill   atorvastatin (LIPITOR) 80 MG tablet Take by mouth.     No current facility-administered medications for this visit.    Patient confirms/reports the following allergies:  Allergies  Allergen Reactions   Penicillins     No orders of the defined types were placed in this encounter.   AUTHORIZATION INFORMATION Primary Insurance: 1D#: Group #:  Secondary Insurance: 1D#: Group #:  SCHEDULE INFORMATION: Date: 11/10/2021  Time: Location: [place]

## 2021-10-27 ENCOUNTER — Encounter: Payer: Self-pay | Admitting: Gastroenterology

## 2021-11-10 ENCOUNTER — Other Ambulatory Visit: Payer: Self-pay

## 2021-11-10 ENCOUNTER — Ambulatory Visit
Admission: RE | Admit: 2021-11-10 | Discharge: 2021-11-10 | Disposition: A | Discharge status: Home / Self Care | Payer: Medicare PPO | Attending: Gastroenterology | Admitting: Gastroenterology

## 2021-11-10 ENCOUNTER — Ambulatory Visit: Payer: Medicare PPO | Admitting: Anesthesiology

## 2021-11-10 ENCOUNTER — Encounter
Admission: RE | Disposition: A | Discharge status: Home / Self Care | Payer: Self-pay | Source: Home / Self Care | Attending: Gastroenterology

## 2021-11-10 DIAGNOSIS — Z1211 Encounter for screening for malignant neoplasm of colon: Secondary | ICD-10-CM | POA: Insufficient documentation

## 2021-11-10 DIAGNOSIS — D125 Benign neoplasm of sigmoid colon: Secondary | ICD-10-CM | POA: Diagnosis not present

## 2021-11-10 DIAGNOSIS — D122 Benign neoplasm of ascending colon: Secondary | ICD-10-CM | POA: Diagnosis not present

## 2021-11-10 DIAGNOSIS — Z860100 Personal history of colon polyps, unspecified: Secondary | ICD-10-CM

## 2021-11-10 DIAGNOSIS — K644 Residual hemorrhoidal skin tags: Secondary | ICD-10-CM | POA: Diagnosis not present

## 2021-11-10 DIAGNOSIS — K219 Gastro-esophageal reflux disease without esophagitis: Secondary | ICD-10-CM | POA: Insufficient documentation

## 2021-11-10 DIAGNOSIS — K5730 Diverticulosis of large intestine without perforation or abscess without bleeding: Secondary | ICD-10-CM | POA: Diagnosis not present

## 2021-11-10 DIAGNOSIS — D124 Benign neoplasm of descending colon: Secondary | ICD-10-CM | POA: Insufficient documentation

## 2021-11-10 DIAGNOSIS — K635 Polyp of colon: Secondary | ICD-10-CM | POA: Diagnosis not present

## 2021-11-10 HISTORY — DX: Motion sickness, initial encounter: T75.3XXA

## 2021-11-10 HISTORY — PX: COLONOSCOPY WITH PROPOFOL: SHX5780

## 2021-11-10 HISTORY — PX: POLYPECTOMY: SHX5525

## 2021-11-10 SURGERY — COLONOSCOPY WITH PROPOFOL
Anesthesia: General | Site: Rectum

## 2021-11-10 MED ORDER — STERILE WATER FOR IRRIGATION IR SOLN
Status: DC | PRN
  Administered 2021-11-10: 11:00:00 100 mL

## 2021-11-10 MED ORDER — PROPOFOL 10 MG/ML IV BOLUS
INTRAVENOUS | Status: DC | PRN
  Administered 2021-11-10: 40 mg via INTRAVENOUS
  Administered 2021-11-10 (×2): 30 mg via INTRAVENOUS
  Administered 2021-11-10: 40 mg via INTRAVENOUS
  Administered 2021-11-10: 130 mg via INTRAVENOUS
  Administered 2021-11-10: 30 mg via INTRAVENOUS
  Administered 2021-11-10: 40 mg via INTRAVENOUS
  Administered 2021-11-10: 30 mg via INTRAVENOUS
  Administered 2021-11-10: 40 mg via INTRAVENOUS
  Administered 2021-11-10 (×2): 30 mg via INTRAVENOUS

## 2021-11-10 MED ORDER — LACTATED RINGERS IV SOLN: INTRAVENOUS | Status: DC

## 2021-11-10 MED ORDER — ACETAMINOPHEN 10 MG/ML IV SOLN: 1000.0000 mg | Freq: Once | INTRAVENOUS | Status: DC | PRN

## 2021-11-10 MED ORDER — STERILE WATER FOR IRRIGATION IR SOLN
Status: DC | PRN
  Administered 2021-11-10: 250 mL

## 2021-11-10 MED ORDER — ONDANSETRON HCL 4 MG/2ML IJ SOLN: 4.0000 mg | Freq: Once | INTRAMUSCULAR | Status: DC | PRN

## 2021-11-10 MED ORDER — LIDOCAINE HCL (CARDIAC) PF 100 MG/5ML IV SOSY
PREFILLED_SYRINGE | INTRAVENOUS | Status: DC | PRN
  Administered 2021-11-10: 30 mg via INTRAVENOUS

## 2021-11-10 MED ORDER — SODIUM CHLORIDE 0.9 % IV SOLN: INTRAVENOUS | Status: DC

## 2021-11-10 SURGICAL SUPPLY — 8 items
GOWN CVR UNV OPN BCK APRN NK (MISCELLANEOUS) ×4 IMPLANT
GOWN ISOL THUMB LOOP REG UNIV (MISCELLANEOUS) ×6
KIT PRC NS LF DISP ENDO (KITS) ×2 IMPLANT
KIT PROCEDURE OLYMPUS (KITS) ×3
MANIFOLD NEPTUNE II (INSTRUMENTS) ×3 IMPLANT
SNARE COLD EXACTO (MISCELLANEOUS) ×3 IMPLANT
TRAP ETRAP POLY (MISCELLANEOUS) ×3 IMPLANT
WATER STERILE IRR 250ML POUR (IV SOLUTION) ×3 IMPLANT

## 2021-11-10 NOTE — Anesthesia Procedure Notes (Signed)
Date/Time: 11/10/2021 10:23 AM Performed by: Cameron Ali, CRNA Pre-anesthesia Checklist: Patient identified, Emergency Drugs available, Suction available, Timeout performed and Patient being monitored Patient Re-evaluated:Patient Re-evaluated prior to induction Oxygen Delivery Method: Nasal cannula Placement Confirmation: positive ETCO2

## 2021-11-10 NOTE — Anesthesia Postprocedure Evaluation (Signed)
Anesthesia Post Note  Patient: Carmen Dalton  Procedure(s) Performed: COLONOSCOPY WITH PROPOFOL (Rectum) POLYPECTOMY (Rectum)     Patient location during evaluation: PACU Anesthesia Type: General Level of consciousness: awake and alert Pain management: pain level controlled Vital Signs Assessment: post-procedure vital signs reviewed and stable Respiratory status: spontaneous breathing, nonlabored ventilation, respiratory function stable and patient connected to nasal cannula oxygen Cardiovascular status: blood pressure returned to baseline and stable Postop Assessment: no apparent nausea or vomiting Anesthetic complications: no   No notable events documented.  Kalie Cabral A  Willford Rabideau

## 2021-11-10 NOTE — Op Note (Signed)
[facility] Gastroenterology Patient Name: Carmen Dalton Procedure Date: 11/10/2021 10:11 AM MRN: [id number] Account #: [id number] Date of Birth: [birth year] Admit Type: Outpatient Age: 70 Room: [facility] OR ROOM 01 Gender: Female Note Status: Finalized Instrument Name: [id number] Procedure:             Colonoscopy Indications:           Surveillance: Personal history of adenomatous polyps                         on last colonoscopy > 5 years ago, Last colonoscopy:                         October 2013 Providers:             Lin Landsman MD, MD Referring MD:          Leona Carry. Hall Busing, MD (Referring MD) Medicines:             General Anesthesia Complications:         No immediate complications. Estimated blood loss: None. Procedure:             Pre-Anesthesia Assessment:                        - Prior to the procedure, a History and Physical was                         performed, and patient medications and allergies were                         reviewed. The patient is competent. The risks and                         benefits of the procedure and the sedation options and                         risks were discussed with the patient. All questions                         were answered and informed consent was obtained.                         Patient identification and proposed procedure were                         verified by the physician, the nurse, the                         anesthesiologist, the anesthetist and the technician                         in the pre-procedure area in the procedure room in the                         endoscopy suite. Mental Status Examination: alert and                         oriented. Airway Examination: normal oropharyngeal  airway and neck mobility. Respiratory Examination:                         clear to auscultation. CV Examination: normal.                         Prophylactic Antibiotics: The patient does not  require                         prophylactic antibiotics. Prior Anticoagulants: The                         patient has taken no previous anticoagulant or                         antiplatelet agents. ASA Grade Assessment: II - A                         patient with mild systemic disease. After reviewing                         the risks and benefits, the patient was deemed in                         satisfactory condition to undergo the procedure. The                         anesthesia plan was to use general anesthesia.                         Immediately prior to administration of medications,                         the patient was re-assessed for adequacy to receive                         sedatives. The heart rate, respiratory rate, oxygen                         saturations, blood pressure, adequacy of pulmonary                         ventilation, and response to care were monitored                         throughout the procedure. The physical status of the                         patient was re-assessed after the procedure.                        After obtaining informed consent, the colonoscope was                         passed under direct vision. Throughout the procedure,                         the patient's blood pressure, pulse, and oxygen  saturations were monitored continuously. The                         Colonoscope was introduced through the anus and                         advanced to the the terminal ileum, with                         identification of the appendiceal orifice and IC                         valve. The colonoscopy was performed with moderate                         difficulty due to inadequate bowel prep. Successful                         completion of the procedure was aided by lavage. The                         patient tolerated the procedure well. The quality of                         the bowel preparation was evaluated  using the BBPS                         [facility] Bowel Preparation Scale) with scores of: Right                         Colon = 2 (minor amount of residual staining, small                         fragments of stool and/or opaque liquid, but mucosa                         seen well), Transverse Colon = 2 (minor amount of                         residual staining, small fragments of stool and/or                         opaque liquid, but mucosa seen well) and Left Colon =                         2 (minor amount of residual staining, small fragments                         of stool and/or opaque liquid, but mucosa seen well).                         The total BBPS score equals 6. Findings:      Hemorrhoids were found on perianal exam.      The terminal ileum appeared normal.      Five sessile polyps were found in the sigmoid colon, descending colon       and transverse colon. The polyps were 4 to  7 mm in size. These polyps       were removed with a cold snare. Resection and retrieval were complete.       Estimated blood loss: none.      Multiple diverticula were found in the recto-sigmoid colon, sigmoid       colon and descending colon.      Non-bleeding external hemorrhoids were found during retroflexion. The       hemorrhoids were medium-sized. Impression:            - Hemorrhoids found on perianal exam.                        - The examined portion of the ileum was normal.                        - Five 4 to 7 mm polyps in the sigmoid colon, in the                         descending colon and in the transverse colon, removed                         with a cold snare. Resected and retrieved.                        - Diverticulosis in the recto-sigmoid colon, in the                         sigmoid colon and in the descending colon.                        - Non-bleeding external hemorrhoids. Recommendation:        - Discharge patient to home (with escort).                        - Resume  previous diet today.                        - Continue present medications.                        - Await pathology results.                        - Repeat colonoscopy in 3 years for surveillance of                         multiple polyps. Procedure Code(s):     --- Professional ---                        [id number], Colonoscopy, flexible; with removal of                         tumor(s), polyp(s), or other lesion(s) by snare                         technique Diagnosis Code(s):     --- Professional ---                        Z86.010,  Personal history of colonic polyps                        K64.4, Residual hemorrhoidal skin tags                        K63.5, Polyp of colon                        K57.30, Diverticulosis of large intestine without                         perforation or abscess without bleeding CPT copyright 2019 American Medical Association. All rights reserved. The codes documented in this report are preliminary and upon coder review may  be revised to meet current compliance requirements. Dr. Ulyess Mort Lin Landsman MD, MD 11/10/2021 10:52:20 AM This report has been signed electronically. Number of Addenda: 0 Note Initiated On: 11/10/2021 10:11 AM Scope Withdrawal Time: 0 hours 17 minutes 23 seconds  Total Procedure Duration: 0 hours 22 minutes 24 seconds  Estimated Blood Loss:  Estimated blood loss: none.      [facility]

## 2021-11-10 NOTE — Anesthesia Preprocedure Evaluation (Signed)
Anesthesia Evaluation  Patient identified by MRN, date of birth, ID band Patient awake    Reviewed: Allergy & Precautions, NPO status , Patient's Chart, lab work & pertinent test results, reviewed documented beta blocker date and time   History of Anesthesia Complications Negative for: history of anesthetic complications  Airway Mallampati: III  TM Distance: >3 FB Neck ROM: Full    Dental   Pulmonary Current SmokerPatient did not abstain from smoking.,    breath sounds clear to auscultation       Cardiovascular hypertension, Pt. on medications and Pt. on home beta blockers (-) angina(-) DOE + dysrhythmias  Rhythm:Regular Rate:Normal   HLD   Neuro/Psych Anxiety    GI/Hepatic GERD  Controlled,  Endo/Other  Hypothyroidism   Renal/GU      Musculoskeletal   Abdominal   Peds  Hematology   Anesthesia Other Findings   Reproductive/Obstetrics                            Anesthesia Physical Anesthesia Plan  ASA: 2  Anesthesia Plan: General   Post-op Pain Management:    Induction: Intravenous  PONV Risk Score and Plan: 3 and Propofol infusion, TIVA and Treatment may vary due to age or medical condition  Airway Management Planned: Natural Airway and Nasal Cannula  Additional Equipment:   Intra-op Plan:   Post-operative Plan:   Informed Consent: I have reviewed the patients History and Physical, chart, labs and discussed the procedure including the risks, benefits and alternatives for the proposed anesthesia with the patient or authorized representative who has indicated his/her understanding and acceptance.       Plan Discussed with: CRNA and Anesthesiologist  Anesthesia Plan Comments:         Anesthesia Quick Evaluation

## 2021-11-10 NOTE — H&P (Signed)
Cephas Darby, MD [address]  [place]  [place], [place] [address]  Main: [phone]  Fax: [phone] Pager: [phone]  Primary Care Physician:  Albina Billet, MD Primary Gastroenterologist:  Dr. Cephas Darby  Pre-Procedure History & Physical: HPI:  Carmen Dalton is a 70 y.o. female is here for an colonoscopy.   Past Medical History:  Diagnosis Date   GERD (gastroesophageal reflux disease)    Hyperlipidemia    [place]    Past Surgical History:  Procedure Laterality Date   BREAST LUMPECTOMY  2019   [place]    Prior to Admission medications   Medication Sig Start Date End Date Taking? Authorizing Provider  alendronate (FOSAMAX) 70 MG tablet Take 70 mg by mouth once a week. Take with a full glass of water on an empty stomach.   Yes [provider]  anastrozole (ARIMIDEX) 1 MG tablet Take 1 mg by mouth daily.   Yes [provider]  atorvastatin (LIPITOR) 80 MG tablet Take by mouth.   Yes [provider]  escitalopram (LEXAPRO) 10 MG tablet Take 10 mg by mouth daily.   Yes [provider]  lansoprazole (PREVACID) 15 MG capsule Take 15 mg by mouth daily at 12 noon.   Yes [provider]    Allergies as of 09/28/2021 - Review Complete 09/28/2021  Allergen Reaction Noted   Penicillins  12/17/2016    Family History  Problem Relation Age of Onset   Prostate cancer Neg Hx    Kidney cancer Neg Hx     Social History   Socioeconomic History   Marital status: Divorced    Spouse name: Not on file   Number of children: Not on file   Years of education: Not on file   Highest education level: Not on file  Occupational History   Not on file  Tobacco Use   Smoking status: Never   Smokeless tobacco: Never  Vaping Use   Vaping Use: Never used  Substance and Sexual Activity   Alcohol use: No   Drug use: No   Sexual activity: Not on file  Other Topics Concern    Not on file  Social History Narrative   Not on file   Social Determinants of Health   Financial Resource Strain: Not on file  Food Insecurity: Not on file  Transportation Needs: Not on file  Physical Activity: Not on file  Stress: Not on file  Social Connections: Not on file  Intimate Partner Violence: Not on file    Review of Systems: See HPI, otherwise negative ROS  Physical Exam: BP (!) 164/95   Pulse 92   Temp 97.9 F (36.6 C) (Temporal)   Resp 20   Ht 5\' 4"  (1.626 m)   Wt 76.7 kg   LMP 12/26/2007 (Approximate)   SpO2 97%   BMI 29.01 kg/m  General:   Alert,  pleasant and cooperative in NAD Head:  Normocephalic and atraumatic. Neck:  Supple; no masses or thyromegaly. Lungs:  Clear throughout to auscultation.    Heart:  Regular rate and rhythm. Abdomen:  Soft, nontender and nondistended. Normal bowel sounds, without guarding, and without rebound.   Neurologic:  Alert and  oriented x4;  grossly normal neurologically.  Impression/Plan: Carmen Dalton is here for an colonoscopy to be performed for h/o adenoma colon  Risks, benefits, limitations, and alternatives regarding  colonoscopy have been reviewed with the patient.  Questions have been answered.  All parties agreeable.   Sherri Sear, MD  11/10/2021, 9:52 AM

## 2021-11-10 NOTE — Transfer of Care (Signed)
Immediate Anesthesia Transfer of Care Note  Patient: Carmen Dalton  Procedure(s) Performed: COLONOSCOPY WITH PROPOFOL (Rectum) POLYPECTOMY (Rectum)  Patient Location: PACU  Anesthesia Type: General  Level of Consciousness: awake, alert  and patient cooperative  Airway and Oxygen Therapy: Patient Spontanous Breathing and Patient connected to supplemental oxygen  Post-op Assessment: Post-op Vital signs reviewed, Patient's Cardiovascular Status Stable, Respiratory Function Stable, Patent Airway and No signs of Nausea or vomiting  Post-op Vital Signs: Reviewed and stable  Complications: No notable events documented.

## 2021-11-11 ENCOUNTER — Encounter: Payer: Self-pay | Admitting: Gastroenterology

## 2021-11-11 LAB — SURGICAL PATHOLOGY

## 2023-08-24 DIAGNOSIS — Z853 Personal history of malignant neoplasm of breast: Secondary | ICD-10-CM | POA: Insufficient documentation

## 2024-09-17 ENCOUNTER — Ambulatory Visit

## 2024-09-17 VITALS — BP 140/68 | HR 76 | Ht 64.0 in | Wt 174.8 lb

## 2024-09-17 DIAGNOSIS — K219 Gastro-esophageal reflux disease without esophagitis: Secondary | ICD-10-CM | POA: Insufficient documentation

## 2024-09-17 DIAGNOSIS — M810 Age-related osteoporosis without current pathological fracture: Secondary | ICD-10-CM | POA: Diagnosis not present

## 2024-09-17 DIAGNOSIS — D0511 Intraductal carcinoma in situ of right breast: Secondary | ICD-10-CM

## 2024-09-17 DIAGNOSIS — Z853 Personal history of malignant neoplasm of breast: Secondary | ICD-10-CM | POA: Diagnosis not present

## 2024-09-17 DIAGNOSIS — Z1231 Encounter for screening mammogram for malignant neoplasm of breast: Secondary | ICD-10-CM

## 2024-09-17 DIAGNOSIS — F419 Anxiety disorder, unspecified: Secondary | ICD-10-CM | POA: Diagnosis not present

## 2024-09-17 DIAGNOSIS — F32A Depression, unspecified: Secondary | ICD-10-CM | POA: Diagnosis not present

## 2024-09-17 MED ORDER — VITAMIN D 25 MCG (1000 UNIT) PO TABS: 1000.0000 [IU] | ORAL_TABLET | Freq: Every day | ORAL | 3 refills | Status: AC

## 2024-09-17 MED ORDER — METOPROLOL TARTRATE 50 MG PO TABS: 50.0000 mg | ORAL_TABLET | Freq: Two times a day (BID) | ORAL | 3 refills | Status: AC

## 2024-09-17 MED ORDER — LANSOPRAZOLE 15 MG PO CPDR: 15.0000 mg | DELAYED_RELEASE_CAPSULE | Freq: Every day | ORAL | 3 refills | Status: AC

## 2024-09-17 MED ORDER — ATORVASTATIN CALCIUM 80 MG PO TABS: 80.0000 mg | ORAL_TABLET | Freq: Every day | ORAL | 3 refills | Status: AC

## 2024-09-17 MED ORDER — ALENDRONATE SODIUM 70 MG PO TABS: 70.0000 mg | ORAL_TABLET | ORAL | 3 refills | Status: AC

## 2024-09-17 MED ORDER — ESCITALOPRAM OXALATE 10 MG PO TABS: 10.0000 mg | ORAL_TABLET | Freq: Every day | ORAL | 3 refills | Status: AC

## 2024-09-17 NOTE — Progress Notes (Signed)
 New Patient Visit   Physician: Walton Digilio A Kimberlie Csaszar, MD  Patient: Carmen Dalton   DOB: 02-Sep-1951   73 y.o. Female  MRN: [id number] Visit Date: 09/17/2024   Chief Complaint  Patient presents with   Establish Care   Subjective  Carmen Dalton is a 73 y.o. female who presents today as a new patient to establish care.   HPI  Discussed the use of AI scribe software for clinical note transcription with the patient, who gave verbal consent to proceed.  History of Present Illness   Carmen Dalton is a 73 year old female who presents for a new patient visit and medication review.  Breast neoplasm surveillance - History of breast cancer, status post lumpectomy in 2019 - No recurrence or related issues since surgery - Last mammogram performed one year ago; due for repeat mammogram  Osteoporosis and bone health - On alendronate  and vitamin D  supplementation - Last bone density scan (approximately two years ago) showed T-score of -2.5 at femoral neck, consistent with borderline osteoporosis  Hyperlipidemia management - On atorvastatin  80 mg daily - No adverse effects from statin therapy  Gastroesophageal reflux symptoms - On lansoprazole  50 mg, taken one and a half times daily for heartburn - No current complaints of heartburn  Blood pressure control - Blood pressure typically in the 130s when checked at home.  Takeing 1.5 tabs of 50 mg metoprolol  daily  Anxiety and depression - On escitalopram  for stress management, initially prescribed for work-related stress - Continues to experience stress related to family matters, especially following the death of her daughter from breast cancer five years ago - Finds escitalopram  helpful for stress management  General health and functional status - No chest pain - Good energy levels - Normal bowel movements - Sleeping well - Remains physically active, walks for exercise, and has a new puppy that increases activity level     Declines  influenza and other vaccines    ASSESSMENT & PLAN  Encounter Diagnoses  Name Primary?   Anxiety and depression Yes   Gastroesophageal reflux disease without esophagitis    Age-related osteoporosis without current pathological fracture    History of breast cancer in female    Ductal carcinoma in situ (DCIS) of right breast    Screening mammogram for breast cancer     Orders Placed This Encounter  Procedures   MM 3D SCREENING MAMMOGRAM BILATERAL BREAST   CBC with Differential/Platelet   Comprehensive metabolic panel with GFR   Hemoglobin A1c   Lipid panel   Urinalysis, Routine w reflex microscopic   VITAMIN D  25 Hydroxy (Vit-D Deficiency, Fractures)    Assessment and Plan    - Encourage regular walking and weight-bearing exercises. - Discuss balanced diet with adequate calcium  intake.  Breast Cancer, Status Post Lumpectomy (2019) Due for mammogram. - Inquire about scheduling mammogram at preferred imaging center.  Osteopenia and Osteoporosis Osteopenia and osteoporosis with T-score of -2.5. Bone density well-managed. - Continue alendronate . - Encourage calcium  and vitamin D  intake.  She does supplement vitamin D  - Consider weight-bearing exercises.  Hyperlipidemia Managed with atorvastatin . No issues reported. - Continue atorvastatin  daily.  Hypertension Well-controlled with current medication. Home readings in 130s. - Continue current medication regimen. - Monitor blood pressure at home.  Depression and Anxiety Managed with escitalopram . Reports stress but medication effective. - Continue escitalopram . - Refill escitalopram  prescription.  Gastroesophageal Reflux Disease (GERD) Managed with lansoprazole  50 mg. Good symptom control. - Continue lansoprazole  50 mg daily.  Will fu in 3-4 weeks with labs.   Meds refilled today     Objective  BP (!) 140/68 (BP Location: Right Arm, Patient Position: Sitting)   Pulse 76   Ht 5' 4 (1.626 m)   Wt 174 lb  12.8 oz (79.3 kg)   LMP 12/26/2007 (Approximate)   SpO2 97%   BMI 30.00 kg/m      Review of Systems  Constitutional:  Negative for chills, fever and weight loss.  Eyes:  Negative for blurred vision. h Respiratory:  Negative for cough and shortness of breath.   Cardiovascular:  Negative for chest pain and palpitations.  Skin:  Negative for rash.  Psychiatric/Behavioral:  Negative for depression. The patient is not nervous/anxious.      Physical Exam Physical Exam Vitals reviewed.  Constitutional:      Appearance: Normal appearance. Well-developed with normal weight.  HENT:     Head: Normocephalic and atraumatic.  Normal mucous membranes, no oral lesions Eyes:     Pupils: Pupils are equal, round, and reactive to light.  Neck:     Thyroid: No thyroid mass or thyromegaly.  Cardiovascular:     Rate and Rhythm: Normal rate and regular rhythm. Normal heart sounds. Normal peripheral pulses Pulmonary:     Normal breath sounds with normal effort Abdominal:   Abdomen is soft, without tenderness or noted hepatosplenomegaly Musculoskeletal:        General: No swelling or edema  Lymphadenopathy:     Cervical: No cervical adenopathy.  Skin:    General: Skin is warm and dry without noticeable rash. Neurological:     General: No focal deficit present.  Psychiatric:        Mood and Affect: Mood, behavior and cognition normal   Past Medical History:  Diagnosis Date   GERD (gastroesophageal reflux disease)    Hyperlipidemia    [place]   Past Surgical History:  Procedure Laterality Date   BREAST LUMPECTOMY  2019   CESAREAN SECTION  1980   COLONOSCOPY WITH PROPOFOL  N/A 11/10/2021   Procedure: COLONOSCOPY WITH PROPOFOL ;  Surgeon: Unk Corinn Skiff, MD;  [facility];  Service: Endoscopy;  Laterality: N/A;   POLYPECTOMY  11/10/2021   Procedure: POLYPECTOMY;  Surgeon: Unk Corinn Skiff, MD;  Location: [facility];   Service: Endoscopy;;   Family Status  Relation Name Status   Neg Hx  (Not Specified)  No partnership data on file   Family History  Problem Relation Age of Onset   Prostate cancer Neg Hx    Kidney cancer Neg Hx    Social History   Socioeconomic History   Marital status: Divorced    Spouse name: Not on file   Number of children: Not on file   Years of education: Not on file   Highest education level: 12th grade  Occupational History   Not on file  Tobacco Use   Smoking status: Never   Smokeless tobacco: Never  Vaping Use   Vaping status: Never Used  Substance and Sexual Activity   Alcohol use: No   Drug use: No   Sexual activity: Not on file  Other Topics Concern   Not on file  Social History Narrative   Not on file   Social Drivers of Health   Financial Resource Strain: Low Risk  (09/15/2024)   Overall Financial Resource Strain (CARDIA)    Difficulty of Paying Living Expenses: Not hard at all  Food Insecurity:  No Food Insecurity (09/15/2024)   Hunger Vital Sign    Worried About Running Out of Food in the Last Year: Never true    Ran Out of Food in the Last Year: Never true  Transportation Needs: Unknown (09/15/2024)   PRAPARE - Administrator, Civil Service (Medical): No    Lack of Transportation (Non-Medical): Not on file  Physical Activity: Sufficiently Active (09/15/2024)   Exercise Vital Sign    Days of Exercise per Week: 7 days    Minutes of Exercise per Session: 60 min  Stress: No Stress Concern Present (09/15/2024)   [employer] of Occupational Health - Occupational Stress Questionnaire    Feeling of Stress: Not at all  Social Connections: Moderately Isolated (09/15/2024)   Social Connection and Isolation Panel    Frequency of Communication with Friends and Family: More than three times a week    Frequency of Social Gatherings with Friends and Family: Twice a week    Attends Religious Services: More than 4 times per year    Active Member  of [employer] or Organizations: No    Attends Engineer, structural: Not on file    Marital Status: Divorced   Outpatient Medications Prior to Visit  Medication Sig   [DISCONTINUED] alendronate  (FOSAMAX ) 70 MG tablet Take 70 mg by mouth once a week. Take with a full glass of water  on an empty stomach.   [DISCONTINUED] atorvastatin  (LIPITOR) 80 MG tablet Take by mouth.   [DISCONTINUED] cholecalciferol (VITAMIN D3) 25 MCG (1000 UNIT) tablet Take 1,000 Units by mouth daily.   [DISCONTINUED] escitalopram  (LEXAPRO ) 10 MG tablet Take 10 mg by mouth daily.   [DISCONTINUED] lansoprazole  (PREVACID ) 15 MG capsule Take 15 mg by mouth daily at 12 noon.   [DISCONTINUED] metoprolol  tartrate (LOPRESSOR ) 50 MG tablet Take 50 mg by mouth 2 (two) times daily.   [DISCONTINUED] anastrozole (ARIMIDEX) 1 MG tablet Take 1 mg by mouth daily.   No facility-administered medications prior to visit.   Allergies  Allergen Reactions   Penicillins      There is no immunization history on file for this patient.  Health Maintenance  Topic Date Due   Medicare Annual Wellness (AWV)  Never done   COVID-19 Vaccine (1) Never done   Hepatitis C Screening  Never done   DTaP/Tdap/Td (1 - Tdap) Never done   Zoster Vaccines- Shingrix (1 of 2) Never done   Pneumococcal Vaccine: 50+ Years (1 of 1 - PCV) Never done   DEXA SCAN  Never done   Influenza Vaccine  Never done   Colonoscopy  11/10/2024   HPV VACCINES  Aged Out   Meningococcal B Vaccine  Aged Out    Patient Care Team: Corlis Honor BROCKS, MD as PCP - General (Internal Medicine)  Depression Screen    09/17/2024    1:29 PM  PHQ 2/9 Scores  PHQ - 2 Score 0  PHQ- 9 Score 0     Parris DELENA Juneau, MD  [facility] [phone] (phone) [phone] (fax)  [facility]

## 2024-09-26 LAB — HM MAMMOGRAPHY

## 2024-09-29 ENCOUNTER — Other Ambulatory Visit

## 2024-09-29 DIAGNOSIS — K219 Gastro-esophageal reflux disease without esophagitis: Secondary | ICD-10-CM

## 2024-09-29 DIAGNOSIS — F32A Depression, unspecified: Secondary | ICD-10-CM

## 2024-09-29 DIAGNOSIS — M810 Age-related osteoporosis without current pathological fracture: Secondary | ICD-10-CM

## 2024-09-30 LAB — CBC WITH DIFFERENTIAL/PLATELET
Absolute Lymphocytes: 1659 {cells}/uL (ref 850–3900)
Absolute Monocytes: 693 {cells}/uL (ref 200–950)
Basophils Absolute: 91 {cells}/uL (ref 0–200)
Basophils Relative: 1.3 %
Eosinophils Absolute: 238 {cells}/uL (ref 15–500)
Eosinophils Relative: 3.4 %
HCT: 43.2 % (ref 35.0–45.0)
Hemoglobin: 13.8 g/dL (ref 11.7–15.5)
MCH: 28.5 pg (ref 27.0–33.0)
MCHC: 31.9 g/dL — ABNORMAL LOW (ref 32.0–36.0)
MCV: 89.1 fL (ref 80.0–100.0)
MPV: 9.9 fL (ref 7.5–12.5)
Monocytes Relative: 9.9 %
Neutro Abs: 4319 {cells}/uL (ref 1500–7800)
Neutrophils Relative %: 61.7 %
Platelets: 325 Thousand/uL (ref 140–400)
RBC: 4.85 Million/uL (ref 3.80–5.10)
RDW: 13.3 % (ref 11.0–15.0)
Total Lymphocyte: 23.7 %
WBC: 7 Thousand/uL (ref 3.8–10.8)

## 2024-09-30 LAB — COMPREHENSIVE METABOLIC PANEL WITH GFR
AG Ratio: 1.6 (calc) (ref 1.0–2.5)
ALT: 17 U/L (ref 6–29)
AST: 16 U/L (ref 10–35)
Albumin: 4.1 g/dL (ref 3.6–5.1)
Alkaline phosphatase (APISO): 104 U/L (ref 37–153)
BUN: 17 mg/dL (ref 7–25)
CO2: 29 mmol/L (ref 20–32)
Calcium: 8.9 mg/dL (ref 8.6–10.4)
Chloride: 103 mmol/L (ref 98–110)
Creat: 0.84 mg/dL (ref 0.60–1.00)
Globulin: 2.6 g/dL (ref 1.9–3.7)
Glucose, Bld: 92 mg/dL (ref 65–99)
Potassium: 4.5 mmol/L (ref 3.5–5.3)
Sodium: 140 mmol/L (ref 135–146)
Total Bilirubin: 1.2 mg/dL (ref 0.2–1.2)
Total Protein: 6.7 g/dL (ref 6.1–8.1)
eGFR: 74 mL/min/1.73m2 (ref >=60)

## 2024-09-30 LAB — LIPID PANEL
Cholesterol: 192 mg/dL (ref <200)
HDL: 44 mg/dL — ABNORMAL LOW (ref >=50)
LDL Cholesterol (Calc): 118 mg/dL — ABNORMAL HIGH
Non-HDL Cholesterol (Calc): 148 mg/dL — ABNORMAL HIGH (ref <130)
Total CHOL/HDL Ratio: 4.4 (calc) (ref <5.0)
Triglycerides: 187 mg/dL — ABNORMAL HIGH (ref <150)

## 2024-09-30 LAB — URINALYSIS, ROUTINE W REFLEX MICROSCOPIC
Bacteria, UA: NONE SEEN /HPF
Bilirubin Urine: NEGATIVE
Glucose, UA: NEGATIVE
Hgb urine dipstick: NEGATIVE
Hyaline Cast: NONE SEEN /LPF
Ketones, ur: NEGATIVE
Nitrite: NEGATIVE
Protein, ur: NEGATIVE
RBC / HPF: NONE SEEN /HPF (ref 0–2)
Specific Gravity, Urine: 1.017 (ref 1.001–1.035)
pH: 6 (ref 5.0–8.0)

## 2024-09-30 LAB — MICROSCOPIC MESSAGE

## 2024-09-30 LAB — HEMOGLOBIN A1C
Hgb A1c MFr Bld: 6 % — ABNORMAL HIGH (ref <5.7)
Mean Plasma Glucose: 126 mg/dL
eAG (mmol/L): 7 mmol/L

## 2024-09-30 LAB — VITAMIN D 25 HYDROXY (VIT D DEFICIENCY, FRACTURES): Vit D, 25-Hydroxy: 38 ng/mL (ref 30–100)

## 2024-10-06 ENCOUNTER — Ambulatory Visit

## 2024-10-06 VITALS — BP 130/68 | HR 58 | Ht 64.0 in | Wt 175.0 lb

## 2024-10-06 DIAGNOSIS — E782 Mixed hyperlipidemia: Secondary | ICD-10-CM | POA: Diagnosis not present

## 2024-10-06 DIAGNOSIS — Z6830 Body mass index (BMI) 30.0-30.9, adult: Secondary | ICD-10-CM

## 2024-10-06 DIAGNOSIS — F419 Anxiety disorder, unspecified: Secondary | ICD-10-CM

## 2024-10-06 DIAGNOSIS — E669 Obesity, unspecified: Secondary | ICD-10-CM | POA: Insufficient documentation

## 2024-10-06 DIAGNOSIS — I10 Essential (primary) hypertension: Secondary | ICD-10-CM | POA: Insufficient documentation

## 2024-10-06 DIAGNOSIS — F32A Depression, unspecified: Secondary | ICD-10-CM

## 2024-10-06 DIAGNOSIS — E785 Hyperlipidemia, unspecified: Secondary | ICD-10-CM | POA: Insufficient documentation

## 2024-10-06 DIAGNOSIS — R7303 Prediabetes: Secondary | ICD-10-CM | POA: Insufficient documentation

## 2024-10-06 DIAGNOSIS — Z853 Personal history of malignant neoplasm of breast: Secondary | ICD-10-CM

## 2024-10-06 DIAGNOSIS — E66811 Obesity, class 1: Secondary | ICD-10-CM

## 2024-10-06 DIAGNOSIS — E6609 Other obesity due to excess calories: Secondary | ICD-10-CM

## 2024-10-06 NOTE — Progress Notes (Signed)
 Progress Note  Physician: Criston Chancellor A Jarnell Cordaro, MD   HPI: Carmen Dalton is a 73 y.o. female presenting on 10/06/2024 for Follow-up .  Discussed the use of AI scribe software for clinical note transcription with the patient, who gave verbal consent to proceed.  History of Present Illness   Carmen Dalton is a 73 year old female who presents for routine follow-up and review of recent blood work.  - Feels well overall with no specific complaints  Dyslipidemia - Cholesterol levels slightly elevated on recent blood work - Currently taking atorvastatin  (Lipitor) 80 mg Lipid Panel     Component Value Date/Time   CHOL 192 09/29/2024 0943   TRIG 187 (H) 09/29/2024 0943   HDL 44 (L) 09/29/2024 0943   CHOLHDL 4.4 09/29/2024 0943   LDLCALC 118 (H) 09/29/2024 0943    Prediabetes - Hemoglobin A1c is 6.0% - Family history of diabetes (sister on medication)  - Diet and exercise could be improved - she has begun using weight  - BMI of 30/Obese  Breast density - Recent annual mammogram shows dense breast tissue, but normal result  - History of breast cancer  Blood pressure management - Home blood pressure readings stable.  Continues on metoprolol  50 mg tabs - No symptoms of dizziness or lightheadedness  Mood stability - Currently taking escitalopram  10  - No concerns regarding mood or mental health      Medical history:  Relevant past medical, surgical, family and social history reviewed and updated as indicated. Interim medical history since our last visit reviewed.  Allergies and medications reviewed and updated.   ROS: Negative unless specifically indicated above in HPI.    Current Outpatient Medications:    alendronate  (FOSAMAX ) 70 MG tablet, Take 1 tablet (70 mg total) by mouth once a week. Take with a full glass of water  on an empty stomach., Disp: 90 tablet, Rfl: 3   atorvastatin  (LIPITOR) 80 MG tablet, Take 1 tablet (80 mg total) by mouth daily., Disp: 90  tablet, Rfl: 3   cholecalciferol (VITAMIN D3) 25 MCG (1000 UNIT) tablet, Take 1 tablet (1,000 Units total) by mouth daily., Disp: 90 tablet, Rfl: 3   escitalopram  (LEXAPRO ) 10 MG tablet, Take 1 tablet (10 mg total) by mouth daily., Disp: 90 tablet, Rfl: 3   lansoprazole  (PREVACID ) 15 MG capsule, Take 1 capsule (15 mg total) by mouth daily at 12 noon., Disp: 90 capsule, Rfl: 3   metoprolol  tartrate (LOPRESSOR ) 50 MG tablet, Take 1 tablet (50 mg total) by mouth 2 (two) times daily., Disp: 180 tablet, Rfl: 3       Objective:     BP 130/68   Pulse (!) 58   Ht 5' 4 (1.626 m)   Wt 175 lb (79.4 kg)   LMP 12/26/2007 (Approximate)   SpO2 97%   BMI 30.04 kg/m   Wt Readings from Last 3 Encounters:  10/06/24 175 lb (79.4 kg)  09/17/24 174 lb 12.8 oz (79.3 kg)  11/10/21 169 lb (76.7 kg)    Physical Exam  Physical Exam Vitals reviewed.  Constitutional:      Appearance: Normal appearance. Well-developed with normal weight.  Cardiovascular:     Rate and Rhythm: Normal rate and regular rhythm. Normal heart sounds. Normal peripheral pulses Pulmonary:     Normal breath sounds with normal effort Skin:    General: Skin is warm and dry without noticeable rash. Neurological:     General:  No focal deficit present.  Psychiatric:        Mood and Affect: Mood, behavior and cognition normal      Assessment & Plan:   Encounter Diagnoses  Name Primary?   Mixed hyperlipidemia Yes   History of breast cancer in female    Hypertension, unspecified type    Anxiety and depression     No orders of the defined types were placed in this encounter.    Assessment and Plan    Essential hypertension Blood pressure controlled at 130/68 mmHg. - Continue current blood pressure medication regimen.  Hyperlipidemia Cholesterol slightly elevated. On maximum dose of Lipitor (80 mg). - Encourage regular exercise and dietary modifications.  Prediabetes Hemoglobin A1c at 6.0, indicating prediabetes.  Monitoring necessary. - Monitor hemoglobin A1c in six months. - Encourage regular exercise and dietary modifications.  Depression Well-managed on escitalopram  with no issues reported. - Continue current medication regimen.     Plan to follow up in 6 months with repeat labs. Declines influenza vaccine

## 2024-12-19 ENCOUNTER — Ambulatory Visit

## 2024-12-31 ENCOUNTER — Ambulatory Visit

## 2024-12-31 VITALS — BP 141/80 | Ht 64.0 in | Wt 162.0 lb

## 2024-12-31 DIAGNOSIS — Z0000 Encounter for general adult medical examination without abnormal findings: Secondary | ICD-10-CM

## 2024-12-31 NOTE — Progress Notes (Signed)
 " I connected with  Carmen Dalton on 12/31/2024 by a audio enabled telemedicine application and verified that I am speaking with the correct person using two identifiers.  Patient Location: Home  Provider Location: Office/Clinic  Persons Participating in Visit: Patient.  I discussed the limitations of evaluation and management by telemedicine. The patient expressed understanding and agreed to proceed.  Vital Signs: Because this visit was a virtual/telehealth visit, some criteria may be missing or patient reported. Any vitals not documented were not able to be obtained and vitals that have been documented are patient reported.  Chief Complaint  Patient presents with   Medicare Wellness     Subjective:   Carmen Dalton is a 74 y.o. female who presents for a Medicare Annual Wellness Visit.  Visit info / Clinical Intake: Medicare Wellness Visit Type:: Subsequent Annual Wellness Visit Persons participating in visit and providing information:: patient Medicare Wellness Visit Mode:: Telephone If telephone:: video declined Since this visit was completed virtually, some vitals may be partially provided or unavailable. Missing vitals are due to the limitations of the virtual format.: Documented vitals are patient reported If Telephone or Video please confirm:: I connected with patient using audio/video enable telemedicine. I verified patient identity with two identifiers, discussed telehealth limitations, and patient agreed to proceed. Patient Location:: home Provider Location:: office Interpreter Needed?: No Pre-visit prep was completed: yes AWV questionnaire completed by patient prior to visit?: yes Date:: 12/28/24 Living arrangements:: (!) (Patient-Rptd) lives alone Patient's Overall Health Status Rating: (Patient-Rptd) very good Typical amount of pain: (Patient-Rptd) none Does pain affect daily life?: (Patient-Rptd) no Are you currently prescribed opioids?: no  Dietary Habits and  Nutritional Risks How many meals a day?: (Patient-Rptd) 2 Eats fruit and vegetables daily?: yes Most meals are obtained by: (Patient-Rptd) preparing own meals In the last 2 weeks, have you had any of the following?: none Diabetic:: no  Functional Status Activities of Daily Living (to include ambulation/medication): (Patient-Rptd) Independent Ambulation: (Patient-Rptd) Independent Medication Administration: (Patient-Rptd) Independent Home Management (perform basic housework or laundry): (Patient-Rptd) Independent Manage your own finances?: (Patient-Rptd) yes Primary transportation is: (Patient-Rptd) driving Concerns about vision?: no *vision screening is required for WTM* Concerns about hearing?: no  Fall Screening Falls in the past year?: (Patient-Rptd) 0 Number of falls in past year: 0 Was there an injury with Fall?: 0 Fall Risk Category Calculator: 0 Patient Fall Risk Level: Low Fall Risk  Fall Risk Patient at Risk for Falls Due to: No Fall Risks  Home and Transportation Safety: All rugs have non-skid backing?: (Patient-Rptd) yes All stairs or steps have railings?: (Patient-Rptd) yes Grab bars in the bathtub or shower?: (!) (Patient-Rptd) no Have non-skid surface in bathtub or shower?: (Patient-Rptd) yes Good home lighting?: (Patient-Rptd) yes Regular seat belt use?: (Patient-Rptd) yes Hospital stays in the last year:: (Patient-Rptd) no  Cognitive Assessment Difficulty concentrating, remembering, or making decisions? : (Patient-Rptd) no Will 6CIT or Mini Cog be Completed: yes What year is it?: 0 points What month is it?: 0 points Give patient an address phrase to remember (5 components): remember words apple , table, penny About what time is it?: 0 points Count backwards from 20 to 1: 0 points Say the months of the year in reverse: 0 points Repeat the address phrase from earlier: 0 points 6 CIT Score: 0 points  Advance Directives (For Healthcare) Does Patient Have a  Medical Advance Directive?: Yes Does patient want to make changes to medical advance directive?: No - Patient declined Type of  Advance Directive: Healthcare Power of [place]; Living will Copy of Healthcare Power of Attorney in Chart?: No - copy requested Copy of Living Will in Chart?: No - copy requested  Reviewed/Updated  Reviewed/Updated: Reviewed All (Medical, Surgical, Family, Medications, Allergies, Care Teams, Patient Goals)    Allergies (verified) Penicillins   Current Medications (verified) Outpatient Encounter Medications as of 12/31/2024  Medication Sig   alendronate  (FOSAMAX ) 70 MG tablet Take 1 tablet (70 mg total) by mouth once a week. Take with a full glass of water  on an empty stomach.   atorvastatin  (LIPITOR) 80 MG tablet Take 1 tablet (80 mg total) by mouth daily.   cholecalciferol (VITAMIN D3) 25 MCG (1000 UNIT) tablet Take 1 tablet (1,000 Units total) by mouth daily.   escitalopram  (LEXAPRO ) 10 MG tablet Take 1 tablet (10 mg total) by mouth daily.   lansoprazole  (PREVACID ) 15 MG capsule Take 1 capsule (15 mg total) by mouth daily at 12 noon.   metoprolol  tartrate (LOPRESSOR ) 50 MG tablet Take 1 tablet (50 mg total) by mouth 2 (two) times daily.   No facility-administered encounter medications on file as of 12/31/2024.    History: Past Medical History:  Diagnosis Date   Cancer (HCC) 08/2018   GERD (gastroesophageal reflux disease)    Hyperlipidemia    Hypertension    [place]   Past Surgical History:  Procedure Laterality Date   BREAST LUMPECTOMY  2019   CESAREAN SECTION  1980   COLONOSCOPY WITH PROPOFOL  N/A 11/10/2021   Procedure: COLONOSCOPY WITH PROPOFOL ;  Surgeon: Unk Corinn Skiff, MD;  Location: [facility];  Service: Endoscopy;  Laterality: N/A;   POLYPECTOMY  11/10/2021   Procedure: POLYPECTOMY;  Surgeon: Unk Corinn Skiff, MD;  Location: [facility];  Service: Endoscopy;;   Family History   Problem Relation Age of Onset   Heart disease Sister    Prostate cancer Neg Hx    Kidney cancer Neg Hx    Social History   Occupational History   Not on file  Tobacco Use   Smoking status: Never   Smokeless tobacco: Never  Vaping Use   Vaping status: Never Used  Substance and Sexual Activity   Alcohol use: Never   Drug use: Never   Sexual activity: Not Currently   Tobacco Counseling Counseling given: Not Answered  SDOH Screenings   Food Insecurity: No Food Insecurity (12/31/2024)  Housing: Low Risk (12/31/2024)  Transportation Needs: No Transportation Needs (12/31/2024)  Utilities: Not At Risk (12/31/2024)  Depression (PHQ2-9): Low Risk (12/31/2024)  Financial Resource Strain: Low Risk (10/03/2024)  Physical Activity: Sufficiently Active (12/31/2024)  Recent Concern: Physical Activity - Insufficiently Active (10/03/2024)  Social Connections: Moderately Integrated (12/31/2024)  Recent Concern: Social Connections - Moderately Isolated (10/03/2024)  Stress: No Stress Concern Present (12/31/2024)  Tobacco Use: Low Risk (12/31/2024)  Health Literacy: Adequate Health Literacy (12/31/2024)   See flowsheets for full screening details  Depression Screen PHQ 2 & 9 Depression Scale- Over the past 2 weeks, how often have you been bothered by any of the following problems? Little interest or pleasure in doing things: 0 Feeling down, depressed, or hopeless (PHQ Adolescent also includes...irritable): 0 PHQ-2 Total Score: 0 Trouble falling or staying asleep, or sleeping too much: 0 Feeling tired or having little energy: 0 Poor appetite or overeating (PHQ Adolescent also includes...weight loss): 0 Feeling bad about yourself - or that you are a failure or have let yourself or your family down: 0  Trouble concentrating on things, such as reading the newspaper or watching television [facility] Adolescent also includes...like school work): 0 Moving or speaking so slowly that other people could have noticed. Or  the opposite - being so fidgety or restless that you have been moving around a lot more than usual: 0 Thoughts that you would be better off dead, or of hurting yourself in some way: 0 PHQ-9 Total Score: 0 If you checked off any problems, how difficult have these problems made it for you to do your work, take care of things at home, or get along with other people?: Not difficult at all  Depression Treatment Depression Interventions/Treatment : [id number] Score <4 Follow-up Not Indicated     Goals Addressed               This Visit's Progress     Patient Stated (pt-stated)        Patient states she will like to walk more and drink              Objective:    Today's Vitals   12/31/24 1429  BP: (!) 141/80  Weight: 162 lb (73.5 kg)  Height: 5' 4 (1.626 m)   Body mass index is 27.81 kg/m.  Hearing/Vision screen Hearing Screening - Comments:: No difficulties  Vision Screening - Comments:: Patient wears glasses and see Dr. Delinda in [facility]  Immunizations and Health Maintenance Health Maintenance  Topic Date Due   Medicare Annual Wellness (AWV)  Never done   COVID-19 Vaccine (1) Never done   Hepatitis C Screening  Never done   DTaP/Tdap/Td (1 - Tdap) Never done   Zoster Vaccines- Shingrix (1 of 2) Never done   Pneumococcal Vaccine: 50+ Years (1 of 1 - PCV) Never done   Bone Density Scan  Never done   Influenza Vaccine  Never done   Colonoscopy  11/10/2024   Mammogram  09/26/2025   Meningococcal B Vaccine  Aged Out        Assessment/Plan:  This is a routine wellness examination for Riley.  Patient Care Team: Zafirov, Clarissa A, MD as PCP - General (Family Medicine)  I have personally reviewed and noted the following in the patients chart:   Medical and social history Use of alcohol, tobacco or illicit drugs  Current medications and supplements including opioid prescriptions. Functional ability and status Nutritional status Physical activity Advanced  directives List of other physicians Hospitalizations, surgeries, and ER visits in previous 12 months Vitals Screenings to include cognitive, depression, and falls Referrals and appointments  No orders of the defined types were placed in this encounter.  In addition, I have reviewed and discussed with patient certain preventive protocols, quality metrics, and best practice recommendations. A written personalized care plan for preventive services as well as general preventive health recommendations were provided to patient.   Lyle MARLA Right, [place]   12/31/2024   No follow-ups on file.  After Visit Summary: (MyChart) Due to this being a telephonic visit, the after visit summary with patients personalized plan was offered to patient via MyChart   No voiced or noted concerns at this time Vaccines not given: all vacciations that's due were declined today HM Addressed: patient will discuss  screenings later   "

## 2024-12-31 NOTE — Patient Instructions (Signed)
 Carmen Dalton,  Thank you for taking the time for your Medicare Wellness Visit. I appreciate your continued commitment to your health goals. Please review the care plan we discussed, and feel free to reach out if I can assist you further.  Please note that Annual Wellness Visits do not include a physical exam. Some assessments may be limited, especially if the visit was conducted virtually. If needed, we may recommend an in-person follow-up with your provider.  Ongoing Care Seeing your primary care provider every 3 to 6 months helps us  monitor your health and provide consistent, personalized care.   Referrals If a referral was made during today's visit and you haven't received any updates within two weeks, please contact the referred provider directly to check on the status.  Recommended Screenings:  Health Maintenance  Topic Date Due   Medicare Annual Wellness Visit  Never done   COVID-19 Vaccine (1) Never done   Hepatitis C Screening  Never done   DTaP/Tdap/Td vaccine (1 - Tdap) Never done   Zoster (Shingles) Vaccine (1 of 2) Never done   Pneumococcal Vaccine for age over 83 (1 of 1 - PCV) Never done   Osteoporosis screening with Bone Density Scan  Never done   Flu Shot  Never done   Colon Cancer Screening  11/10/2024   Breast Cancer Screening  09/26/2025   Meningitis B Vaccine  Aged Out       12/28/2024    8:53 AM  Advanced Directives  Does Patient Have a Medical Advance Directive? Yes  Type of Estate Agent of [employer];Living will  Does patient want to make changes to medical advance directive? No - Patient declined  Copy of Healthcare Power of Attorney in Chart? No - copy requested    Vision: Annual vision screenings are recommended for early detection of glaucoma, cataracts, and diabetic retinopathy. These exams can also reveal signs of chronic conditions such as diabetes and high blood pressure.  Dental: Annual dental screenings help detect early signs  of oral cancer, gum disease, and other conditions linked to overall health, including heart disease and diabetes.  Please see the attached documents for additional preventive care recommendations.

## 2025-03-23 ENCOUNTER — Other Ambulatory Visit

## 2025-03-30 ENCOUNTER — Ambulatory Visit
